# Patient Record
Sex: Male | Born: 1940
Health system: Southern US, Community
[De-identification: ages and names within clinical notes are randomized; demographics above are authoritative.]

## PROBLEM LIST (undated history)

## (undated) DIAGNOSIS — E119 Type 2 diabetes mellitus without complications: Secondary | ICD-10-CM

## (undated) DIAGNOSIS — I10 Essential (primary) hypertension: Secondary | ICD-10-CM

## (undated) DIAGNOSIS — E785 Hyperlipidemia, unspecified: Secondary | ICD-10-CM

## (undated) HISTORY — PX: BACK SURGERY: SHX140

## (undated) HISTORY — DX: Hyperlipidemia, unspecified: E78.5

## (undated) HISTORY — DX: Type 2 diabetes mellitus without complications: E11.9

## (undated) HISTORY — DX: Essential (primary) hypertension: I10

## (undated) HISTORY — PX: TONSILLECTOMY: SUR1361

## (undated) HISTORY — PX: PROSTATE SURGERY: SHX751

## (undated) HISTORY — PX: HAND SURGERY: SHX662

---

## 2012-04-17 DIAGNOSIS — I10 Essential (primary) hypertension: Secondary | ICD-10-CM | POA: Insufficient documentation

## 2012-04-17 DIAGNOSIS — E782 Mixed hyperlipidemia: Secondary | ICD-10-CM | POA: Insufficient documentation

## 2012-04-17 DIAGNOSIS — H4089 Other specified glaucoma: Secondary | ICD-10-CM | POA: Insufficient documentation

## 2012-09-24 DIAGNOSIS — N200 Calculus of kidney: Secondary | ICD-10-CM | POA: Insufficient documentation

## 2013-01-31 DIAGNOSIS — N281 Cyst of kidney, acquired: Secondary | ICD-10-CM | POA: Insufficient documentation

## 2013-03-18 DIAGNOSIS — M720 Palmar fascial fibromatosis [Dupuytren]: Secondary | ICD-10-CM | POA: Insufficient documentation

## 2014-10-28 DIAGNOSIS — N400 Enlarged prostate without lower urinary tract symptoms: Secondary | ICD-10-CM | POA: Insufficient documentation

## 2015-06-04 DIAGNOSIS — E119 Type 2 diabetes mellitus without complications: Secondary | ICD-10-CM | POA: Insufficient documentation

## 2015-11-19 DIAGNOSIS — L820 Inflamed seborrheic keratosis: Secondary | ICD-10-CM | POA: Diagnosis not present

## 2015-11-19 DIAGNOSIS — L538 Other specified erythematous conditions: Secondary | ICD-10-CM | POA: Diagnosis not present

## 2015-11-19 DIAGNOSIS — L570 Actinic keratosis: Secondary | ICD-10-CM | POA: Diagnosis not present

## 2015-11-19 DIAGNOSIS — D1801 Hemangioma of skin and subcutaneous tissue: Secondary | ICD-10-CM | POA: Diagnosis not present

## 2015-11-19 DIAGNOSIS — R238 Other skin changes: Secondary | ICD-10-CM | POA: Diagnosis not present

## 2015-12-04 DIAGNOSIS — Z0000 Encounter for general adult medical examination without abnormal findings: Secondary | ICD-10-CM | POA: Diagnosis not present

## 2015-12-04 DIAGNOSIS — E789 Disorder of lipoprotein metabolism, unspecified: Secondary | ICD-10-CM | POA: Diagnosis not present

## 2015-12-08 DIAGNOSIS — I10 Essential (primary) hypertension: Secondary | ICD-10-CM | POA: Diagnosis not present

## 2015-12-08 DIAGNOSIS — E782 Mixed hyperlipidemia: Secondary | ICD-10-CM | POA: Diagnosis not present

## 2015-12-08 DIAGNOSIS — E119 Type 2 diabetes mellitus without complications: Secondary | ICD-10-CM | POA: Diagnosis not present

## 2015-12-08 DIAGNOSIS — R1312 Dysphagia, oropharyngeal phase: Secondary | ICD-10-CM | POA: Diagnosis not present

## 2015-12-14 DIAGNOSIS — R1312 Dysphagia, oropharyngeal phase: Secondary | ICD-10-CM | POA: Diagnosis not present

## 2015-12-14 DIAGNOSIS — R05 Cough: Secondary | ICD-10-CM | POA: Diagnosis not present

## 2015-12-14 DIAGNOSIS — R1310 Dysphagia, unspecified: Secondary | ICD-10-CM | POA: Diagnosis not present

## 2016-03-16 DIAGNOSIS — R400 Somnolence: Secondary | ICD-10-CM | POA: Diagnosis not present

## 2016-03-16 DIAGNOSIS — E782 Mixed hyperlipidemia: Secondary | ICD-10-CM | POA: Diagnosis not present

## 2016-03-16 DIAGNOSIS — E119 Type 2 diabetes mellitus without complications: Secondary | ICD-10-CM | POA: Diagnosis not present

## 2016-03-16 DIAGNOSIS — I10 Essential (primary) hypertension: Secondary | ICD-10-CM | POA: Diagnosis not present

## 2016-03-22 DIAGNOSIS — R400 Somnolence: Secondary | ICD-10-CM | POA: Diagnosis not present

## 2016-03-22 DIAGNOSIS — G4710 Hypersomnia, unspecified: Secondary | ICD-10-CM | POA: Diagnosis not present

## 2016-03-22 DIAGNOSIS — G4700 Insomnia, unspecified: Secondary | ICD-10-CM | POA: Diagnosis not present

## 2016-03-22 DIAGNOSIS — R0683 Snoring: Secondary | ICD-10-CM | POA: Diagnosis not present

## 2016-04-13 DIAGNOSIS — H53432 Sector or arcuate defects, left eye: Secondary | ICD-10-CM | POA: Diagnosis not present

## 2016-04-13 DIAGNOSIS — H2513 Age-related nuclear cataract, bilateral: Secondary | ICD-10-CM | POA: Diagnosis not present

## 2016-04-13 DIAGNOSIS — H401123 Primary open-angle glaucoma, left eye, severe stage: Secondary | ICD-10-CM | POA: Diagnosis not present

## 2016-04-13 DIAGNOSIS — H401111 Primary open-angle glaucoma, right eye, mild stage: Secondary | ICD-10-CM | POA: Diagnosis not present

## 2016-04-13 DIAGNOSIS — H524 Presbyopia: Secondary | ICD-10-CM | POA: Diagnosis not present

## 2016-04-25 DIAGNOSIS — G4761 Periodic limb movement disorder: Secondary | ICD-10-CM | POA: Diagnosis not present

## 2016-04-25 DIAGNOSIS — R400 Somnolence: Secondary | ICD-10-CM | POA: Diagnosis not present

## 2016-04-25 DIAGNOSIS — G4710 Hypersomnia, unspecified: Secondary | ICD-10-CM | POA: Diagnosis not present

## 2016-04-25 DIAGNOSIS — G4733 Obstructive sleep apnea (adult) (pediatric): Secondary | ICD-10-CM | POA: Diagnosis not present

## 2016-04-25 DIAGNOSIS — G4700 Insomnia, unspecified: Secondary | ICD-10-CM | POA: Diagnosis not present

## 2016-04-25 DIAGNOSIS — R0683 Snoring: Secondary | ICD-10-CM | POA: Diagnosis not present

## 2016-05-04 DIAGNOSIS — G4733 Obstructive sleep apnea (adult) (pediatric): Secondary | ICD-10-CM | POA: Diagnosis not present

## 2016-06-06 DIAGNOSIS — G4733 Obstructive sleep apnea (adult) (pediatric): Secondary | ICD-10-CM | POA: Diagnosis not present

## 2016-07-13 DIAGNOSIS — E782 Mixed hyperlipidemia: Secondary | ICD-10-CM | POA: Diagnosis not present

## 2016-07-13 DIAGNOSIS — R432 Parageusia: Secondary | ICD-10-CM | POA: Insufficient documentation

## 2016-07-13 DIAGNOSIS — I10 Essential (primary) hypertension: Secondary | ICD-10-CM | POA: Diagnosis not present

## 2016-07-13 DIAGNOSIS — Z23 Encounter for immunization: Secondary | ICD-10-CM | POA: Diagnosis not present

## 2016-07-13 DIAGNOSIS — E119 Type 2 diabetes mellitus without complications: Secondary | ICD-10-CM | POA: Diagnosis not present

## 2016-07-14 DIAGNOSIS — G4733 Obstructive sleep apnea (adult) (pediatric): Secondary | ICD-10-CM | POA: Insufficient documentation

## 2016-07-14 DIAGNOSIS — G4731 Primary central sleep apnea: Secondary | ICD-10-CM | POA: Diagnosis not present

## 2016-07-14 DIAGNOSIS — R0609 Other forms of dyspnea: Secondary | ICD-10-CM | POA: Diagnosis not present

## 2016-07-14 DIAGNOSIS — G4700 Insomnia, unspecified: Secondary | ICD-10-CM | POA: Diagnosis not present

## 2016-07-20 DIAGNOSIS — I517 Cardiomegaly: Secondary | ICD-10-CM | POA: Diagnosis not present

## 2016-07-20 DIAGNOSIS — G4731 Primary central sleep apnea: Secondary | ICD-10-CM | POA: Diagnosis not present

## 2016-07-20 DIAGNOSIS — R0609 Other forms of dyspnea: Secondary | ICD-10-CM | POA: Diagnosis not present

## 2016-07-20 DIAGNOSIS — I083 Combined rheumatic disorders of mitral, aortic and tricuspid valves: Secondary | ICD-10-CM | POA: Diagnosis not present

## 2016-08-10 DIAGNOSIS — M25511 Pain in right shoulder: Secondary | ICD-10-CM | POA: Diagnosis not present

## 2016-08-10 DIAGNOSIS — I440 Atrioventricular block, first degree: Secondary | ICD-10-CM | POA: Diagnosis not present

## 2016-08-10 DIAGNOSIS — G8929 Other chronic pain: Secondary | ICD-10-CM | POA: Diagnosis not present

## 2016-08-10 DIAGNOSIS — E782 Mixed hyperlipidemia: Secondary | ICD-10-CM | POA: Diagnosis not present

## 2016-08-10 DIAGNOSIS — Z23 Encounter for immunization: Secondary | ICD-10-CM | POA: Diagnosis not present

## 2016-08-11 DIAGNOSIS — G4733 Obstructive sleep apnea (adult) (pediatric): Secondary | ICD-10-CM | POA: Diagnosis not present

## 2016-08-17 DIAGNOSIS — M19012 Primary osteoarthritis, left shoulder: Secondary | ICD-10-CM | POA: Diagnosis not present

## 2016-08-17 DIAGNOSIS — M25511 Pain in right shoulder: Secondary | ICD-10-CM | POA: Diagnosis not present

## 2016-08-17 DIAGNOSIS — G8929 Other chronic pain: Secondary | ICD-10-CM | POA: Diagnosis not present

## 2016-08-18 DIAGNOSIS — M19012 Primary osteoarthritis, left shoulder: Secondary | ICD-10-CM | POA: Diagnosis not present

## 2016-08-23 DIAGNOSIS — G8929 Other chronic pain: Secondary | ICD-10-CM | POA: Diagnosis not present

## 2016-08-23 DIAGNOSIS — Z01818 Encounter for other preprocedural examination: Secondary | ICD-10-CM | POA: Diagnosis not present

## 2016-08-23 DIAGNOSIS — M25512 Pain in left shoulder: Secondary | ICD-10-CM | POA: Diagnosis not present

## 2016-08-29 DIAGNOSIS — Z7984 Long term (current) use of oral hypoglycemic drugs: Secondary | ICD-10-CM | POA: Diagnosis not present

## 2016-08-29 DIAGNOSIS — N281 Cyst of kidney, acquired: Secondary | ICD-10-CM | POA: Diagnosis present

## 2016-08-29 DIAGNOSIS — N200 Calculus of kidney: Secondary | ICD-10-CM | POA: Diagnosis present

## 2016-08-29 DIAGNOSIS — M7522 Bicipital tendinitis, left shoulder: Secondary | ICD-10-CM | POA: Diagnosis not present

## 2016-08-29 DIAGNOSIS — Z9889 Other specified postprocedural states: Secondary | ICD-10-CM | POA: Diagnosis not present

## 2016-08-29 DIAGNOSIS — M19012 Primary osteoarthritis, left shoulder: Secondary | ICD-10-CM | POA: Diagnosis not present

## 2016-08-29 DIAGNOSIS — M25712 Osteophyte, left shoulder: Secondary | ICD-10-CM | POA: Diagnosis present

## 2016-08-29 DIAGNOSIS — G4733 Obstructive sleep apnea (adult) (pediatric): Secondary | ICD-10-CM | POA: Diagnosis present

## 2016-08-29 DIAGNOSIS — I10 Essential (primary) hypertension: Secondary | ICD-10-CM | POA: Diagnosis present

## 2016-08-29 DIAGNOSIS — E119 Type 2 diabetes mellitus without complications: Secondary | ICD-10-CM | POA: Diagnosis present

## 2016-08-29 DIAGNOSIS — E782 Mixed hyperlipidemia: Secondary | ICD-10-CM | POA: Diagnosis present

## 2016-08-29 DIAGNOSIS — Z87891 Personal history of nicotine dependence: Secondary | ICD-10-CM | POA: Diagnosis not present

## 2016-08-29 DIAGNOSIS — G8918 Other acute postprocedural pain: Secondary | ICD-10-CM | POA: Diagnosis not present

## 2016-09-06 DIAGNOSIS — Z96612 Presence of left artificial shoulder joint: Secondary | ICD-10-CM | POA: Diagnosis not present

## 2016-09-06 DIAGNOSIS — M25512 Pain in left shoulder: Secondary | ICD-10-CM | POA: Diagnosis not present

## 2016-09-06 DIAGNOSIS — R29898 Other symptoms and signs involving the musculoskeletal system: Secondary | ICD-10-CM | POA: Diagnosis not present

## 2016-09-06 DIAGNOSIS — M25612 Stiffness of left shoulder, not elsewhere classified: Secondary | ICD-10-CM | POA: Diagnosis not present

## 2016-09-08 DIAGNOSIS — M7989 Other specified soft tissue disorders: Secondary | ICD-10-CM | POA: Diagnosis not present

## 2016-09-08 DIAGNOSIS — Z96612 Presence of left artificial shoulder joint: Secondary | ICD-10-CM | POA: Diagnosis not present

## 2016-09-08 DIAGNOSIS — R200 Anesthesia of skin: Secondary | ICD-10-CM | POA: Diagnosis not present

## 2016-09-08 DIAGNOSIS — M25512 Pain in left shoulder: Secondary | ICD-10-CM | POA: Diagnosis not present

## 2016-09-08 DIAGNOSIS — R29898 Other symptoms and signs involving the musculoskeletal system: Secondary | ICD-10-CM | POA: Diagnosis not present

## 2016-09-08 DIAGNOSIS — M25612 Stiffness of left shoulder, not elsewhere classified: Secondary | ICD-10-CM | POA: Diagnosis not present

## 2016-09-08 DIAGNOSIS — Z471 Aftercare following joint replacement surgery: Secondary | ICD-10-CM | POA: Diagnosis not present

## 2016-09-12 DIAGNOSIS — F4321 Adjustment disorder with depressed mood: Secondary | ICD-10-CM | POA: Diagnosis not present

## 2016-09-12 DIAGNOSIS — E119 Type 2 diabetes mellitus without complications: Secondary | ICD-10-CM | POA: Diagnosis not present

## 2016-09-12 DIAGNOSIS — G8929 Other chronic pain: Secondary | ICD-10-CM | POA: Diagnosis not present

## 2016-09-12 DIAGNOSIS — M25511 Pain in right shoulder: Secondary | ICD-10-CM | POA: Diagnosis not present

## 2016-09-12 DIAGNOSIS — I10 Essential (primary) hypertension: Secondary | ICD-10-CM | POA: Diagnosis not present

## 2016-09-14 DIAGNOSIS — R29898 Other symptoms and signs involving the musculoskeletal system: Secondary | ICD-10-CM | POA: Diagnosis not present

## 2016-09-14 DIAGNOSIS — M25512 Pain in left shoulder: Secondary | ICD-10-CM | POA: Diagnosis not present

## 2016-09-14 DIAGNOSIS — Z96612 Presence of left artificial shoulder joint: Secondary | ICD-10-CM | POA: Diagnosis not present

## 2016-09-14 DIAGNOSIS — M25612 Stiffness of left shoulder, not elsewhere classified: Secondary | ICD-10-CM | POA: Diagnosis not present

## 2016-09-16 DIAGNOSIS — M25512 Pain in left shoulder: Secondary | ICD-10-CM | POA: Diagnosis not present

## 2016-09-16 DIAGNOSIS — Z96612 Presence of left artificial shoulder joint: Secondary | ICD-10-CM | POA: Diagnosis not present

## 2016-09-16 DIAGNOSIS — M25612 Stiffness of left shoulder, not elsewhere classified: Secondary | ICD-10-CM | POA: Diagnosis not present

## 2016-09-16 DIAGNOSIS — R29898 Other symptoms and signs involving the musculoskeletal system: Secondary | ICD-10-CM | POA: Diagnosis not present

## 2016-09-20 DIAGNOSIS — M25512 Pain in left shoulder: Secondary | ICD-10-CM | POA: Diagnosis not present

## 2016-09-20 DIAGNOSIS — R29898 Other symptoms and signs involving the musculoskeletal system: Secondary | ICD-10-CM | POA: Diagnosis not present

## 2016-09-20 DIAGNOSIS — Z96612 Presence of left artificial shoulder joint: Secondary | ICD-10-CM | POA: Diagnosis not present

## 2016-09-20 DIAGNOSIS — M25612 Stiffness of left shoulder, not elsewhere classified: Secondary | ICD-10-CM | POA: Diagnosis not present

## 2016-09-22 DIAGNOSIS — Z96612 Presence of left artificial shoulder joint: Secondary | ICD-10-CM | POA: Diagnosis not present

## 2016-09-22 DIAGNOSIS — M25612 Stiffness of left shoulder, not elsewhere classified: Secondary | ICD-10-CM | POA: Diagnosis not present

## 2016-09-22 DIAGNOSIS — R29898 Other symptoms and signs involving the musculoskeletal system: Secondary | ICD-10-CM | POA: Diagnosis not present

## 2016-09-22 DIAGNOSIS — M25512 Pain in left shoulder: Secondary | ICD-10-CM | POA: Diagnosis not present

## 2016-09-27 DIAGNOSIS — M25512 Pain in left shoulder: Secondary | ICD-10-CM | POA: Diagnosis not present

## 2016-09-27 DIAGNOSIS — R29898 Other symptoms and signs involving the musculoskeletal system: Secondary | ICD-10-CM | POA: Diagnosis not present

## 2016-09-27 DIAGNOSIS — Z96612 Presence of left artificial shoulder joint: Secondary | ICD-10-CM | POA: Diagnosis not present

## 2016-09-27 DIAGNOSIS — M25612 Stiffness of left shoulder, not elsewhere classified: Secondary | ICD-10-CM | POA: Diagnosis not present

## 2016-09-29 DIAGNOSIS — R29898 Other symptoms and signs involving the musculoskeletal system: Secondary | ICD-10-CM | POA: Diagnosis not present

## 2016-09-29 DIAGNOSIS — Z96612 Presence of left artificial shoulder joint: Secondary | ICD-10-CM | POA: Diagnosis not present

## 2016-09-29 DIAGNOSIS — M25512 Pain in left shoulder: Secondary | ICD-10-CM | POA: Diagnosis not present

## 2016-09-29 DIAGNOSIS — M25612 Stiffness of left shoulder, not elsewhere classified: Secondary | ICD-10-CM | POA: Diagnosis not present

## 2016-10-04 DIAGNOSIS — Z96612 Presence of left artificial shoulder joint: Secondary | ICD-10-CM | POA: Diagnosis not present

## 2016-10-04 DIAGNOSIS — M25612 Stiffness of left shoulder, not elsewhere classified: Secondary | ICD-10-CM | POA: Diagnosis not present

## 2016-10-04 DIAGNOSIS — R29898 Other symptoms and signs involving the musculoskeletal system: Secondary | ICD-10-CM | POA: Diagnosis not present

## 2016-10-04 DIAGNOSIS — M25512 Pain in left shoulder: Secondary | ICD-10-CM | POA: Diagnosis not present

## 2016-10-11 DIAGNOSIS — M25512 Pain in left shoulder: Secondary | ICD-10-CM | POA: Diagnosis not present

## 2016-10-11 DIAGNOSIS — R29898 Other symptoms and signs involving the musculoskeletal system: Secondary | ICD-10-CM | POA: Diagnosis not present

## 2016-10-11 DIAGNOSIS — Z96612 Presence of left artificial shoulder joint: Secondary | ICD-10-CM | POA: Diagnosis not present

## 2016-10-11 DIAGNOSIS — M25612 Stiffness of left shoulder, not elsewhere classified: Secondary | ICD-10-CM | POA: Diagnosis not present

## 2016-10-13 DIAGNOSIS — Z96612 Presence of left artificial shoulder joint: Secondary | ICD-10-CM | POA: Diagnosis not present

## 2016-10-13 DIAGNOSIS — M25612 Stiffness of left shoulder, not elsewhere classified: Secondary | ICD-10-CM | POA: Diagnosis not present

## 2016-10-13 DIAGNOSIS — M25512 Pain in left shoulder: Secondary | ICD-10-CM | POA: Diagnosis not present

## 2016-10-13 DIAGNOSIS — R29898 Other symptoms and signs involving the musculoskeletal system: Secondary | ICD-10-CM | POA: Diagnosis not present

## 2016-10-17 DIAGNOSIS — R29898 Other symptoms and signs involving the musculoskeletal system: Secondary | ICD-10-CM | POA: Diagnosis not present

## 2016-10-17 DIAGNOSIS — Z96612 Presence of left artificial shoulder joint: Secondary | ICD-10-CM | POA: Diagnosis not present

## 2016-10-17 DIAGNOSIS — M25512 Pain in left shoulder: Secondary | ICD-10-CM | POA: Diagnosis not present

## 2016-10-17 DIAGNOSIS — M25612 Stiffness of left shoulder, not elsewhere classified: Secondary | ICD-10-CM | POA: Diagnosis not present

## 2016-10-19 DIAGNOSIS — H401111 Primary open-angle glaucoma, right eye, mild stage: Secondary | ICD-10-CM | POA: Diagnosis not present

## 2016-10-19 DIAGNOSIS — H401123 Primary open-angle glaucoma, left eye, severe stage: Secondary | ICD-10-CM | POA: Diagnosis not present

## 2016-10-19 DIAGNOSIS — H53432 Sector or arcuate defects, left eye: Secondary | ICD-10-CM | POA: Diagnosis not present

## 2016-10-20 DIAGNOSIS — M25612 Stiffness of left shoulder, not elsewhere classified: Secondary | ICD-10-CM | POA: Diagnosis not present

## 2016-10-20 DIAGNOSIS — Z96612 Presence of left artificial shoulder joint: Secondary | ICD-10-CM | POA: Diagnosis not present

## 2016-10-20 DIAGNOSIS — R29898 Other symptoms and signs involving the musculoskeletal system: Secondary | ICD-10-CM | POA: Diagnosis not present

## 2016-10-20 DIAGNOSIS — M25512 Pain in left shoulder: Secondary | ICD-10-CM | POA: Diagnosis not present

## 2016-10-24 DIAGNOSIS — M25512 Pain in left shoulder: Secondary | ICD-10-CM | POA: Diagnosis not present

## 2016-10-24 DIAGNOSIS — R29898 Other symptoms and signs involving the musculoskeletal system: Secondary | ICD-10-CM | POA: Diagnosis not present

## 2016-10-24 DIAGNOSIS — Z96612 Presence of left artificial shoulder joint: Secondary | ICD-10-CM | POA: Diagnosis not present

## 2016-10-24 DIAGNOSIS — M25612 Stiffness of left shoulder, not elsewhere classified: Secondary | ICD-10-CM | POA: Diagnosis not present

## 2016-10-27 DIAGNOSIS — M25512 Pain in left shoulder: Secondary | ICD-10-CM | POA: Diagnosis not present

## 2016-10-27 DIAGNOSIS — M25612 Stiffness of left shoulder, not elsewhere classified: Secondary | ICD-10-CM | POA: Diagnosis not present

## 2016-10-27 DIAGNOSIS — Z96612 Presence of left artificial shoulder joint: Secondary | ICD-10-CM | POA: Diagnosis not present

## 2016-10-27 DIAGNOSIS — R29898 Other symptoms and signs involving the musculoskeletal system: Secondary | ICD-10-CM | POA: Diagnosis not present

## 2016-10-31 DIAGNOSIS — Z96612 Presence of left artificial shoulder joint: Secondary | ICD-10-CM | POA: Diagnosis not present

## 2016-10-31 DIAGNOSIS — R29898 Other symptoms and signs involving the musculoskeletal system: Secondary | ICD-10-CM | POA: Diagnosis not present

## 2016-10-31 DIAGNOSIS — M25512 Pain in left shoulder: Secondary | ICD-10-CM | POA: Diagnosis not present

## 2016-10-31 DIAGNOSIS — M25612 Stiffness of left shoulder, not elsewhere classified: Secondary | ICD-10-CM | POA: Diagnosis not present

## 2016-11-02 DIAGNOSIS — Z96612 Presence of left artificial shoulder joint: Secondary | ICD-10-CM | POA: Diagnosis not present

## 2016-11-02 DIAGNOSIS — G4733 Obstructive sleep apnea (adult) (pediatric): Secondary | ICD-10-CM | POA: Diagnosis not present

## 2016-11-02 DIAGNOSIS — E119 Type 2 diabetes mellitus without complications: Secondary | ICD-10-CM | POA: Diagnosis not present

## 2016-11-02 DIAGNOSIS — M25512 Pain in left shoulder: Secondary | ICD-10-CM | POA: Diagnosis not present

## 2016-11-03 DIAGNOSIS — M25612 Stiffness of left shoulder, not elsewhere classified: Secondary | ICD-10-CM | POA: Diagnosis not present

## 2016-11-03 DIAGNOSIS — R29898 Other symptoms and signs involving the musculoskeletal system: Secondary | ICD-10-CM | POA: Diagnosis not present

## 2016-11-03 DIAGNOSIS — Z96612 Presence of left artificial shoulder joint: Secondary | ICD-10-CM | POA: Diagnosis not present

## 2016-11-03 DIAGNOSIS — M25512 Pain in left shoulder: Secondary | ICD-10-CM | POA: Diagnosis not present

## 2016-11-15 DIAGNOSIS — R29898 Other symptoms and signs involving the musculoskeletal system: Secondary | ICD-10-CM | POA: Diagnosis not present

## 2016-11-15 DIAGNOSIS — M25612 Stiffness of left shoulder, not elsewhere classified: Secondary | ICD-10-CM | POA: Diagnosis not present

## 2016-11-15 DIAGNOSIS — Z96612 Presence of left artificial shoulder joint: Secondary | ICD-10-CM | POA: Diagnosis not present

## 2016-11-18 DIAGNOSIS — R29898 Other symptoms and signs involving the musculoskeletal system: Secondary | ICD-10-CM | POA: Diagnosis not present

## 2016-11-18 DIAGNOSIS — Z96612 Presence of left artificial shoulder joint: Secondary | ICD-10-CM | POA: Diagnosis not present

## 2016-11-18 DIAGNOSIS — M25612 Stiffness of left shoulder, not elsewhere classified: Secondary | ICD-10-CM | POA: Diagnosis not present

## 2016-11-22 DIAGNOSIS — M25612 Stiffness of left shoulder, not elsewhere classified: Secondary | ICD-10-CM | POA: Diagnosis not present

## 2016-11-22 DIAGNOSIS — Z96612 Presence of left artificial shoulder joint: Secondary | ICD-10-CM | POA: Diagnosis not present

## 2016-11-22 DIAGNOSIS — R29898 Other symptoms and signs involving the musculoskeletal system: Secondary | ICD-10-CM | POA: Diagnosis not present

## 2016-11-25 DIAGNOSIS — M25612 Stiffness of left shoulder, not elsewhere classified: Secondary | ICD-10-CM | POA: Diagnosis not present

## 2016-11-25 DIAGNOSIS — R29898 Other symptoms and signs involving the musculoskeletal system: Secondary | ICD-10-CM | POA: Diagnosis not present

## 2016-11-25 DIAGNOSIS — Z96612 Presence of left artificial shoulder joint: Secondary | ICD-10-CM | POA: Diagnosis not present

## 2016-11-29 DIAGNOSIS — Z96612 Presence of left artificial shoulder joint: Secondary | ICD-10-CM | POA: Diagnosis not present

## 2016-11-29 DIAGNOSIS — R29898 Other symptoms and signs involving the musculoskeletal system: Secondary | ICD-10-CM | POA: Diagnosis not present

## 2016-11-29 DIAGNOSIS — M25612 Stiffness of left shoulder, not elsewhere classified: Secondary | ICD-10-CM | POA: Diagnosis not present

## 2016-12-02 DIAGNOSIS — M25612 Stiffness of left shoulder, not elsewhere classified: Secondary | ICD-10-CM | POA: Diagnosis not present

## 2016-12-02 DIAGNOSIS — R29898 Other symptoms and signs involving the musculoskeletal system: Secondary | ICD-10-CM | POA: Diagnosis not present

## 2016-12-02 DIAGNOSIS — Z96612 Presence of left artificial shoulder joint: Secondary | ICD-10-CM | POA: Diagnosis not present

## 2016-12-05 DIAGNOSIS — Z96612 Presence of left artificial shoulder joint: Secondary | ICD-10-CM | POA: Diagnosis not present

## 2016-12-05 DIAGNOSIS — R29898 Other symptoms and signs involving the musculoskeletal system: Secondary | ICD-10-CM | POA: Diagnosis not present

## 2016-12-08 DIAGNOSIS — Z96612 Presence of left artificial shoulder joint: Secondary | ICD-10-CM | POA: Diagnosis not present

## 2016-12-08 DIAGNOSIS — R29898 Other symptoms and signs involving the musculoskeletal system: Secondary | ICD-10-CM | POA: Diagnosis not present

## 2016-12-12 DIAGNOSIS — R29898 Other symptoms and signs involving the musculoskeletal system: Secondary | ICD-10-CM | POA: Diagnosis not present

## 2016-12-12 DIAGNOSIS — Z96612 Presence of left artificial shoulder joint: Secondary | ICD-10-CM | POA: Diagnosis not present

## 2016-12-14 DIAGNOSIS — G4733 Obstructive sleep apnea (adult) (pediatric): Secondary | ICD-10-CM | POA: Diagnosis not present

## 2016-12-15 DIAGNOSIS — R29898 Other symptoms and signs involving the musculoskeletal system: Secondary | ICD-10-CM | POA: Diagnosis not present

## 2016-12-15 DIAGNOSIS — Z96612 Presence of left artificial shoulder joint: Secondary | ICD-10-CM | POA: Diagnosis not present

## 2016-12-16 DIAGNOSIS — M25512 Pain in left shoulder: Secondary | ICD-10-CM | POA: Diagnosis not present

## 2016-12-16 DIAGNOSIS — Z96612 Presence of left artificial shoulder joint: Secondary | ICD-10-CM | POA: Diagnosis not present

## 2016-12-19 DIAGNOSIS — Z96612 Presence of left artificial shoulder joint: Secondary | ICD-10-CM | POA: Diagnosis not present

## 2016-12-19 DIAGNOSIS — R29898 Other symptoms and signs involving the musculoskeletal system: Secondary | ICD-10-CM | POA: Diagnosis not present

## 2016-12-22 DIAGNOSIS — R29898 Other symptoms and signs involving the musculoskeletal system: Secondary | ICD-10-CM | POA: Diagnosis not present

## 2016-12-22 DIAGNOSIS — Z96612 Presence of left artificial shoulder joint: Secondary | ICD-10-CM | POA: Diagnosis not present

## 2016-12-26 DIAGNOSIS — R29898 Other symptoms and signs involving the musculoskeletal system: Secondary | ICD-10-CM | POA: Diagnosis not present

## 2016-12-26 DIAGNOSIS — Z96612 Presence of left artificial shoulder joint: Secondary | ICD-10-CM | POA: Diagnosis not present

## 2016-12-29 DIAGNOSIS — R29898 Other symptoms and signs involving the musculoskeletal system: Secondary | ICD-10-CM | POA: Diagnosis not present

## 2016-12-29 DIAGNOSIS — Z96612 Presence of left artificial shoulder joint: Secondary | ICD-10-CM | POA: Diagnosis not present

## 2017-02-03 DIAGNOSIS — E782 Mixed hyperlipidemia: Secondary | ICD-10-CM | POA: Diagnosis not present

## 2017-02-03 DIAGNOSIS — Z125 Encounter for screening for malignant neoplasm of prostate: Secondary | ICD-10-CM | POA: Diagnosis not present

## 2017-02-03 DIAGNOSIS — E119 Type 2 diabetes mellitus without complications: Secondary | ICD-10-CM | POA: Diagnosis not present

## 2017-02-03 DIAGNOSIS — Z0000 Encounter for general adult medical examination without abnormal findings: Secondary | ICD-10-CM | POA: Diagnosis not present

## 2017-02-03 DIAGNOSIS — G4733 Obstructive sleep apnea (adult) (pediatric): Secondary | ICD-10-CM | POA: Diagnosis not present

## 2017-02-03 DIAGNOSIS — I10 Essential (primary) hypertension: Secondary | ICD-10-CM | POA: Diagnosis not present

## 2017-03-17 DIAGNOSIS — R202 Paresthesia of skin: Secondary | ICD-10-CM | POA: Diagnosis not present

## 2017-03-17 DIAGNOSIS — Z96612 Presence of left artificial shoulder joint: Secondary | ICD-10-CM | POA: Diagnosis not present

## 2017-03-17 DIAGNOSIS — R200 Anesthesia of skin: Secondary | ICD-10-CM | POA: Diagnosis not present

## 2017-03-17 DIAGNOSIS — M25512 Pain in left shoulder: Secondary | ICD-10-CM | POA: Diagnosis not present

## 2017-04-13 DIAGNOSIS — G540 Brachial plexus disorders: Secondary | ICD-10-CM | POA: Diagnosis not present

## 2017-04-13 DIAGNOSIS — G5603 Carpal tunnel syndrome, bilateral upper limbs: Secondary | ICD-10-CM | POA: Diagnosis not present

## 2017-04-13 DIAGNOSIS — G5622 Lesion of ulnar nerve, left upper limb: Secondary | ICD-10-CM | POA: Diagnosis not present

## 2017-04-18 DIAGNOSIS — G5602 Carpal tunnel syndrome, left upper limb: Secondary | ICD-10-CM | POA: Insufficient documentation

## 2017-04-18 DIAGNOSIS — G5622 Lesion of ulnar nerve, left upper limb: Secondary | ICD-10-CM | POA: Insufficient documentation

## 2017-04-18 DIAGNOSIS — G540 Brachial plexus disorders: Secondary | ICD-10-CM | POA: Insufficient documentation

## 2017-04-26 DIAGNOSIS — H2513 Age-related nuclear cataract, bilateral: Secondary | ICD-10-CM | POA: Diagnosis not present

## 2017-04-26 DIAGNOSIS — M6530 Trigger finger, unspecified finger: Secondary | ICD-10-CM | POA: Insufficient documentation

## 2017-04-26 DIAGNOSIS — H401123 Primary open-angle glaucoma, left eye, severe stage: Secondary | ICD-10-CM | POA: Diagnosis not present

## 2017-04-26 DIAGNOSIS — H524 Presbyopia: Secondary | ICD-10-CM | POA: Diagnosis not present

## 2017-04-26 DIAGNOSIS — Z96612 Presence of left artificial shoulder joint: Secondary | ICD-10-CM | POA: Diagnosis not present

## 2017-04-26 DIAGNOSIS — E119 Type 2 diabetes mellitus without complications: Secondary | ICD-10-CM | POA: Diagnosis not present

## 2017-04-26 DIAGNOSIS — G540 Brachial plexus disorders: Secondary | ICD-10-CM | POA: Diagnosis not present

## 2017-04-26 DIAGNOSIS — H53422 Scotoma of blind spot area, left eye: Secondary | ICD-10-CM | POA: Diagnosis not present

## 2017-04-26 DIAGNOSIS — H401111 Primary open-angle glaucoma, right eye, mild stage: Secondary | ICD-10-CM | POA: Diagnosis not present

## 2017-04-26 DIAGNOSIS — G5602 Carpal tunnel syndrome, left upper limb: Secondary | ICD-10-CM | POA: Diagnosis not present

## 2017-05-05 DIAGNOSIS — F419 Anxiety disorder, unspecified: Secondary | ICD-10-CM | POA: Diagnosis not present

## 2017-05-05 DIAGNOSIS — F329 Major depressive disorder, single episode, unspecified: Secondary | ICD-10-CM | POA: Diagnosis not present

## 2017-05-05 DIAGNOSIS — E782 Mixed hyperlipidemia: Secondary | ICD-10-CM | POA: Diagnosis not present

## 2017-05-05 DIAGNOSIS — G540 Brachial plexus disorders: Secondary | ICD-10-CM | POA: Diagnosis not present

## 2017-05-05 DIAGNOSIS — G4733 Obstructive sleep apnea (adult) (pediatric): Secondary | ICD-10-CM | POA: Diagnosis not present

## 2017-05-05 DIAGNOSIS — I10 Essential (primary) hypertension: Secondary | ICD-10-CM | POA: Diagnosis not present

## 2017-05-05 DIAGNOSIS — E119 Type 2 diabetes mellitus without complications: Secondary | ICD-10-CM | POA: Diagnosis not present

## 2017-05-11 DIAGNOSIS — M6530 Trigger finger, unspecified finger: Secondary | ICD-10-CM | POA: Diagnosis not present

## 2017-05-11 DIAGNOSIS — G5602 Carpal tunnel syndrome, left upper limb: Secondary | ICD-10-CM | POA: Diagnosis not present

## 2017-06-14 DIAGNOSIS — G4733 Obstructive sleep apnea (adult) (pediatric): Secondary | ICD-10-CM | POA: Diagnosis not present

## 2017-06-21 DIAGNOSIS — G5602 Carpal tunnel syndrome, left upper limb: Secondary | ICD-10-CM | POA: Diagnosis not present

## 2017-06-21 DIAGNOSIS — M6530 Trigger finger, unspecified finger: Secondary | ICD-10-CM | POA: Diagnosis not present

## 2017-06-21 DIAGNOSIS — M542 Cervicalgia: Secondary | ICD-10-CM | POA: Diagnosis not present

## 2017-07-31 DIAGNOSIS — G5602 Carpal tunnel syndrome, left upper limb: Secondary | ICD-10-CM | POA: Diagnosis not present

## 2017-08-02 ENCOUNTER — Encounter: Payer: Self-pay | Admitting: Osteopathic Medicine

## 2017-08-02 ENCOUNTER — Ambulatory Visit (INDEPENDENT_AMBULATORY_CARE_PROVIDER_SITE_OTHER): Payer: Medicare Other | Admitting: Osteopathic Medicine

## 2017-08-02 VITALS — BP 138/73 | HR 57 | Ht 70.0 in | Wt 185.0 lb

## 2017-08-02 DIAGNOSIS — E119 Type 2 diabetes mellitus without complications: Secondary | ICD-10-CM | POA: Diagnosis not present

## 2017-08-02 DIAGNOSIS — G4733 Obstructive sleep apnea (adult) (pediatric): Secondary | ICD-10-CM | POA: Diagnosis not present

## 2017-08-02 DIAGNOSIS — Z23 Encounter for immunization: Secondary | ICD-10-CM | POA: Diagnosis not present

## 2017-08-02 DIAGNOSIS — H409 Unspecified glaucoma: Secondary | ICD-10-CM | POA: Diagnosis not present

## 2017-08-02 DIAGNOSIS — I10 Essential (primary) hypertension: Secondary | ICD-10-CM

## 2017-08-02 LAB — POCT GLYCOSYLATED HEMOGLOBIN (HGB A1C): Hemoglobin A1C: 7.3

## 2017-08-02 MED ORDER — AMLODIPINE BESYLATE 5 MG PO TABS
5.0000 mg | ORAL_TABLET | Freq: Every day | ORAL | 3 refills | Status: DC
Start: 2017-08-02 — End: 2018-08-06

## 2017-08-02 MED ORDER — SIMVASTATIN 10 MG PO TABS: 10.0000 mg | ORAL_TABLET | Freq: Every day | ORAL | 3 refills | Status: DC

## 2017-08-02 MED ORDER — DULOXETINE HCL 20 MG PO CPEP: 20.0000 mg | ORAL_CAPSULE | Freq: Every day | ORAL | 0 refills | Status: DC

## 2017-08-02 NOTE — Progress Notes (Signed)
HPI: Levi Cochran is a 76 y.o. male  who presents to [facility] today, 08/02/17,  for chief complaint of:  Chief Complaint  Patient presents with  . Establish Care    primary care physcian     CARDIOVASCULAR: hx HTN, HLD. No CP/SOB. No Hx CAD known.   RESPIRATORY: no concerns. Former smoker, quit >30 years ago.   NEUROLOGICAL/PSYCH: Cymbalta for depression but also arthritis/pain control, working well for him. Also occaisonal use of Gabapentin for aches/nerve pains.   URINARY/REPRODUCTIVE: s.p prostate surgery, sounds like reduction for BPH, no hx prostate cancer   GASTROINTESTINAL: no problems  ENDOCRINE: DM2 controlled on Metformin   MUSCULOSKELETAL/RHEUM: L arm/hand pain, following with orthopedics   HEENT: glaucoma and cataracts, needs referral to ophtho locally      Past medical, surgical, social and family history reviewed: Patient Active Problem List   Diagnosis Date Noted  . Trigger finger, acquired 04/26/2017  . Brachial plexopathy 04/18/2017  . Carpal tunnel syndrome of left wrist 04/18/2017  . Ulnar neuropathy at elbow of left upper extremity 04/18/2017  . S/P shoulder replacement, left 10/11/2016  . Situational depression 09/12/2016  . Left shoulder pain 08/23/2016  . OSA (obstructive sleep apnea) 07/14/2016  . Dysgeusia 07/13/2016  . Type 2 diabetes mellitus without complication ([place]) 91/47/8295  . Benign enlargement of prostate 10/28/2014  . Contracture of palmar fascia 03/18/2013  . Acquired cyst of kidney 01/31/2013  . Kidney stone 09/24/2012  . Benign essential hypertension 04/17/2012  . Mixed hyperlipidemia 04/17/2012  . Other specified glaucoma 04/17/2012   Past Surgical History:  Procedure Laterality Date  . BACK SURGERY    . HAND SURGERY    . PROSTATE SURGERY    . TONSILLECTOMY     Social History  Substance Use Topics  . Smoking status: Not on file  . Smokeless tobacco: Not on file  . Alcohol use  Not on file   Family History  Problem Relation Age of Onset  . Cancer Mother   . Diabetes Mother   . Heart attack Sister      Current medication list and allergy/intolerance information reviewed:   Current Outpatient Prescriptions  Medication Sig Dispense Refill  . DULoxetine (CYMBALTA) 20 MG capsule Take 1 capsule (20 mg total) by mouth daily. 90 capsule 0  . gabapentin (NEURONTIN) 300 MG capsule TAKE ONE CAPSULE BY MOUTH NIGHTLY    . latanoprost (XALATAN) 0.005 % ophthalmic solution Administer 1 drop to both eyes daily.     Marland Kitchen lisinopril-hydrochlorothiazide (PRINZIDE,ZESTORETIC) 20-12.5 MG tablet TAKE 2 TABLETS BY MOUTH ONCE DAILY    . metFORMIN (GLUCOPHAGE) 500 MG tablet TAKE TWO TABLETS BY MOUTH TWICE DAILY WITH MEALS    . timolol (TIMOPTIC) 0.5 % ophthalmic solution Administer 1 drop to both eyes Two (2) times a day.     Marland Kitchen amLODipine (NORVASC) 5 MG tablet Take 1 tablet (5 mg total) by mouth daily. 90 tablet 3  . aspirin EC 81 MG tablet Take 81 mg by mouth.    . simvastatin (ZOCOR) 10 MG tablet Take 1 tablet (10 mg total) by mouth daily. 90 tablet 3   No current facility-administered medications for this visit.    No Known Allergies    Review of Systems:  Constitutional:  No  fever, no chills, No recent illness, No unintentional weight changes. No significant fatigue.   HEENT: No  headache, no vision change, no hearing change, No sore throat, No  sinus pressure  Cardiac:  No  chest pain, No  pressure, No palpitations  Respiratory:  No  shortness of breath. No  Cough  Gastrointestinal: No  abdominal pain, No  nausea, No  vomiting,  No  blood in stool, No  diarrhea, No  constipation   Musculoskeletal: No new myalgia/arthralgia  Skin: No  Rash, No other wounds/concerning lesions  Hem/Onc: No  easy bruising/bleeding, No  abnormal lymph node  Endocrine: No cold intolerance,  No heat intolerance. No polyuria/polydipsia/polyphagia   Neurologic: No  weakness, No  dizziness,  No  slurred speech/focal weakness/facial droop  Psychiatric: No  concerns with depression, No  concerns with anxiety, No sleep problems, No mood problems  Exam:  BP 138/73   Pulse (!) 57   Ht 5\' 10"  (1.778 m)   Wt 185 lb (83.9 kg)   BMI 26.54 kg/m   Constitutional: VS see above. General Appearance: alert, well-developed, well-nourished, NAD  Eyes: Normal lids and conjunctive, non-icteric sclera  Ears, Nose, Mouth, Throat: MMM, Normal external inspection ears/nares/mouth/lips/gums. Pharynx/tonsils no erythema, no exudate. Nasal mucosa normal.   Neck: No masses, trachea midline. No thyroid enlargement. No tenderness/mass appreciated. No lymphadenopathy  Respiratory: Normal respiratory effort. no wheeze, no rhonchi, no rales  Cardiovascular: S1/S2 normal, no murmur, no rub/gallop auscultated. RRR. No lower extremity edema.   Gastrointestinal: Nontender, no masses. No hepatomegaly, no splenomegaly. No hernia appreciated. Bowel sounds normal. Rectal exam deferred.   Musculoskeletal: Gait normal. No clubbing/cyanosis of digits.   Neurological: Normal balance/coordination. No tremor.   Skin: warm, dry, intact. No rash/ulcer.   Psychiatric: Normal judgment/insight. Normal mood and affect. Oriented x3.    Results for orders placed or performed in visit on 08/02/17 (from the past 72 hour(s))  POCT HgB A1C     Status: None   Collection Time: 08/02/17 10:32 AM  Result Value Ref Range   Hemoglobin A1C 7.3    Reviewed results in Care Everywhere, no major concerns on labs 01/2016     ASSESSMENT/PLAN:  Chronic conditions stable, follow up routinely for sugar recheck and as needed   Diabetes mellitus without complication ([name]) - Plan: POCT HgB A1C  Need for immunization against influenza - Plan: Flu vaccine HIGH DOSE PF  Need for 23-polyvalent pneumococcal polysaccharide vaccine - Plan: Pneumococcal polysaccharide vaccine 23-valent greater than or equal to 2yo  subcutaneous/IM  Need for diphtheria-tetanus-pertussis (Tdap) vaccine - Plan: Tdap vaccine greater than or equal to 7yo IM  Glaucoma of both eyes, unspecified glaucoma type - Plan: Ambulatory referral to Ophthalmology  OSA (obstructive sleep apnea)  Benign essential hypertension    Patient Instructions  Plan: Update vaccines today Refills today Eye doctor referral     Visit summary with medication list and pertinent instructions was printed for patient to review. All questions at time of visit were answered - patient instructed to contact office with any additional concerns. ER/RTC precautions were reviewed with the patient. Follow-up plan: Return in about 3 months (around 11/01/2017) for recheck sugars .

## 2017-08-02 NOTE — Patient Instructions (Signed)
Plan: Update vaccines today Refills today Eye doctor referral

## 2017-08-10 DIAGNOSIS — Z7982 Long term (current) use of aspirin: Secondary | ICD-10-CM | POA: Diagnosis not present

## 2017-08-10 DIAGNOSIS — G4730 Sleep apnea, unspecified: Secondary | ICD-10-CM | POA: Diagnosis not present

## 2017-08-10 DIAGNOSIS — I10 Essential (primary) hypertension: Secondary | ICD-10-CM | POA: Diagnosis not present

## 2017-08-10 DIAGNOSIS — E119 Type 2 diabetes mellitus without complications: Secondary | ICD-10-CM | POA: Diagnosis not present

## 2017-08-10 DIAGNOSIS — Z9989 Dependence on other enabling machines and devices: Secondary | ICD-10-CM | POA: Diagnosis not present

## 2017-08-10 DIAGNOSIS — Z79899 Other long term (current) drug therapy: Secondary | ICD-10-CM | POA: Diagnosis not present

## 2017-08-10 DIAGNOSIS — Z96619 Presence of unspecified artificial shoulder joint: Secondary | ICD-10-CM | POA: Diagnosis not present

## 2017-08-10 DIAGNOSIS — G5602 Carpal tunnel syndrome, left upper limb: Secondary | ICD-10-CM | POA: Diagnosis not present

## 2017-08-10 DIAGNOSIS — Z7984 Long term (current) use of oral hypoglycemic drugs: Secondary | ICD-10-CM | POA: Diagnosis not present

## 2017-08-10 DIAGNOSIS — M5412 Radiculopathy, cervical region: Secondary | ICD-10-CM | POA: Diagnosis not present

## 2017-08-10 DIAGNOSIS — Z87891 Personal history of nicotine dependence: Secondary | ICD-10-CM | POA: Diagnosis not present

## 2017-08-23 DIAGNOSIS — Z4802 Encounter for removal of sutures: Secondary | ICD-10-CM | POA: Diagnosis not present

## 2017-08-23 DIAGNOSIS — Z48811 Encounter for surgical aftercare following surgery on the nervous system: Secondary | ICD-10-CM | POA: Diagnosis not present

## 2017-08-23 DIAGNOSIS — G5602 Carpal tunnel syndrome, left upper limb: Secondary | ICD-10-CM | POA: Diagnosis not present

## 2017-09-01 ENCOUNTER — Other Ambulatory Visit: Payer: Self-pay

## 2017-09-01 MED ORDER — GABAPENTIN 300 MG PO CAPS: 300.0000 mg | ORAL_CAPSULE | Freq: Every day | ORAL | 0 refills | Status: DC

## 2017-09-01 NOTE — Telephone Encounter (Signed)
Pts wife was informed.

## 2017-09-01 NOTE — Telephone Encounter (Signed)
Pt's wife called and wants to get a refill on his gabapentin. This was previously filled by another provider. Please advise?

## 2017-09-07 ENCOUNTER — Telehealth: Payer: Self-pay

## 2017-09-07 MED ORDER — LISINOPRIL-HYDROCHLOROTHIAZIDE 20-12.5 MG PO TABS: 2.0000 | ORAL_TABLET | Freq: Every day | ORAL | 3 refills | Status: DC

## 2017-09-07 NOTE — Telephone Encounter (Signed)
Patient request refill for Lisinopril 20-12.5. #[phone] refills sent to [facility]. Nereyda Bowler,CMA

## 2017-09-18 DIAGNOSIS — Z4789 Encounter for other orthopedic aftercare: Secondary | ICD-10-CM | POA: Diagnosis not present

## 2017-09-18 DIAGNOSIS — M65342 Trigger finger, left ring finger: Secondary | ICD-10-CM | POA: Diagnosis not present

## 2017-09-18 DIAGNOSIS — G5602 Carpal tunnel syndrome, left upper limb: Secondary | ICD-10-CM | POA: Diagnosis not present

## 2017-09-21 DIAGNOSIS — M65342 Trigger finger, left ring finger: Secondary | ICD-10-CM | POA: Diagnosis not present

## 2017-10-02 ENCOUNTER — Other Ambulatory Visit: Payer: Self-pay | Admitting: Osteopathic Medicine

## 2017-10-02 DIAGNOSIS — E119 Type 2 diabetes mellitus without complications: Secondary | ICD-10-CM | POA: Diagnosis not present

## 2017-10-02 DIAGNOSIS — H02831 Dermatochalasis of right upper eyelid: Secondary | ICD-10-CM | POA: Diagnosis not present

## 2017-10-02 DIAGNOSIS — H527 Unspecified disorder of refraction: Secondary | ICD-10-CM | POA: Diagnosis not present

## 2017-10-02 DIAGNOSIS — H40003 Preglaucoma, unspecified, bilateral: Secondary | ICD-10-CM | POA: Diagnosis not present

## 2017-10-02 DIAGNOSIS — H43811 Vitreous degeneration, right eye: Secondary | ICD-10-CM | POA: Diagnosis not present

## 2017-10-02 DIAGNOSIS — H02834 Dermatochalasis of left upper eyelid: Secondary | ICD-10-CM | POA: Diagnosis not present

## 2017-10-02 DIAGNOSIS — H25813 Combined forms of age-related cataract, bilateral: Secondary | ICD-10-CM | POA: Diagnosis not present

## 2017-10-02 LAB — HM DIABETES EYE EXAM

## 2017-10-27 ENCOUNTER — Encounter: Payer: Self-pay | Admitting: Osteopathic Medicine

## 2017-10-30 ENCOUNTER — Other Ambulatory Visit: Payer: Self-pay | Admitting: Osteopathic Medicine

## 2017-11-01 ENCOUNTER — Encounter: Payer: Self-pay | Admitting: Osteopathic Medicine

## 2017-11-01 ENCOUNTER — Ambulatory Visit (INDEPENDENT_AMBULATORY_CARE_PROVIDER_SITE_OTHER): Payer: Medicare Other | Admitting: Osteopathic Medicine

## 2017-11-01 VITALS — BP 135/65 | HR 55 | Temp 98.0°F | Wt 185.7 lb

## 2017-11-01 DIAGNOSIS — M25511 Pain in right shoulder: Secondary | ICD-10-CM | POA: Diagnosis not present

## 2017-11-01 DIAGNOSIS — E119 Type 2 diabetes mellitus without complications: Secondary | ICD-10-CM

## 2017-11-01 DIAGNOSIS — G8929 Other chronic pain: Secondary | ICD-10-CM | POA: Diagnosis not present

## 2017-11-01 DIAGNOSIS — I10 Essential (primary) hypertension: Secondary | ICD-10-CM

## 2017-11-01 LAB — POCT GLYCOSYLATED HEMOGLOBIN (HGB A1C): HEMOGLOBIN A1C: 7.3

## 2017-11-01 NOTE — Progress Notes (Signed)
HPI: Levi Cochran is a 76 y.o. male  who presents to [facility] today, 11/01/17,  for chief complaint of:  Chief Complaint  Patient presents with  . Follow-up     CARDIOVASCULAR: hx HTN, HLD. No CP/SOB. No Hx CAD known. Doing well.   RESPIRATORY: no concerns. Former smoker, quit >30 years ago.   NEUROLOGICAL/PSYCH: Cymbalta for depression but also arthritis/pain control, working well for him. Also occaisonal use of Gabapentin for aches/nerve pains.   URINARY/REPRODUCTIVE: s/p prostate surgery, sounds like reduction for BPH, no hx prostate cancer. Stable at this time.   ENDOCRINE: DM2 controlled on Metformin. A1C as below indicates good control on current meds.   MUSCULOSKELETAL/RHEUM: L arm/hand pain, following with orthopedics.   HEENT: glaucoma and cataracts, stable, referred to ophtho locally and he has been established there .  Shoulder pain: R shoulder bothers him on occasiona, not today. Thinks may want to see an orthopedist for this.      Past medical, surgical, social and family history reviewed: Patient Active Problem List   Diagnosis Date Noted  . Trigger finger, acquired 04/26/2017  . Brachial plexopathy 04/18/2017  . Carpal tunnel syndrome of left wrist 04/18/2017  . Ulnar neuropathy at elbow of left upper extremity 04/18/2017  . S/P shoulder replacement, left 10/11/2016  . Situational depression 09/12/2016  . Left shoulder pain 08/23/2016  . OSA (obstructive sleep apnea) 07/14/2016  . Dysgeusia 07/13/2016  . Type 2 diabetes mellitus without complication ([place]) 85/12/7739  . Benign enlargement of prostate 10/28/2014  . Contracture of palmar fascia 03/18/2013  . Acquired cyst of kidney 01/31/2013  . Kidney stone 09/24/2012  . Benign essential hypertension 04/17/2012  . Mixed hyperlipidemia 04/17/2012  . Other specified glaucoma 04/17/2012    Social History   Tobacco Use  . Smoking status: Former Smoker    Last  attempt to quit: 11/14/1968    Years since quitting: 48.9  . Smokeless tobacco: Never Used  Substance Use Topics  . Alcohol use: Not on file   Family History  Problem Relation Age of Onset  . Cancer Mother   . Diabetes Mother   . Heart attack Sister      Current medication list and allergy/intolerance information reviewed:   Current Outpatient Medications  Medication Sig Dispense Refill  . amLODipine (NORVASC) 5 MG tablet Take 1 tablet (5 mg total) by mouth daily. 90 tablet 3  . aspirin EC 81 MG tablet Take 81 mg by mouth.    . DULoxetine (CYMBALTA) 20 MG capsule Take 1 capsule (20 mg total) by mouth daily. 90 capsule 0  . gabapentin (NEURONTIN) 300 MG capsule TAKE 1 CAPSULE BY MOUTH AT BEDTIME 30 capsule 0  . latanoprost (XALATAN) 0.005 % ophthalmic solution Administer 1 drop to both eyes daily.     Marland Kitchen lisinopril-hydrochlorothiazide (PRINZIDE,ZESTORETIC) 20-12.5 MG tablet Take 2 tablets by mouth daily. 180 tablet 3  . metFORMIN (GLUCOPHAGE) 500 MG tablet TAKE TWO TABLETS BY MOUTH TWICE DAILY WITH MEALS    . simvastatin (ZOCOR) 10 MG tablet Take 1 tablet (10 mg total) by mouth daily. 90 tablet 3  . timolol (TIMOPTIC) 0.5 % ophthalmic solution Administer 1 drop to both eyes Two (2) times a day.      No current facility-administered medications for this visit.    No Known Allergies    Review of Systems:  Constitutional:  No  fever, no chills, No recent illness,  HEENT: No  headache, no vision change  Cardiac: No  chest pain, No  pressure, No palpitations  Respiratory:  No  shortness of breath. No  Cough  Musculoskeletal: No new myalgia/arthralgia  Neurologic: No  weakness, No  dizziness  Psychiatric: No  concerns with depression, No  concerns with anxiety   Exam:  BP 135/65   Pulse (!) 55   Temp 98 F (36.7 C) (Oral)   Wt 185 lb 11.2 oz (84.2 kg)   BMI 26.65 kg/m   Constitutional: VS see above. General Appearance: alert, well-developed, well-nourished, NAD  Eyes:  Normal lids and conjunctive, non-icteric sclera  Ears, Nose, Mouth, Throat: MMM, Normal external inspection ears/nares/mouth/lips/gums.   Neck: No masses, trachea midline. No thyroid enlargement.   Respiratory: Normal respiratory effort. no wheeze, no rhonchi, no rales  Cardiovascular: S1/S2 normal, no murmur, no rub/gallop auscultated. RRR. No lower extremity edema.   Musculoskeletal: Gait normal. No clubbing/cyanosis of digits.   Neurological: Normal balance/coordination.  Skin: warm, dry, intact. No rash/ulcer.   Psychiatric: Normal judgment/insight. Normal mood and affect. Oriented x3.    Results for orders placed or performed in visit on 11/01/17 (from the past 72 hour(s))  POCT HgB A1C     Status: None   Collection Time: 11/01/17 10:40 AM  Result Value Ref Range   Hemoglobin A1C 7.3    Reviewed results in Care Everywhere, no major concerns on labs 01/2016     ASSESSMENT/PLAN:  Chronic conditions stable, follow up routinely for sugar recheck and as needed   Type 2 diabetes mellitus without complication, without long-term current use of insulin ([place]) - Plan: POCT HgB A1C  Benign essential hypertension  Chronic right shoulder pain    Patient Instructions  If shoulder pain is not better or if it gets worse, I would recommend follow-up with one of our sports medicine specialists (Dr Georgina Snell or Dr. Patton Salles Dr. Darene Lamer) for further evaluation. Just call our office and ask for an appointment for sports medicine!    Otherwise, let us plan to follow-up in March for routine labs/annual physical.  But if you would like to get blood work done at ahead of that visit so we can discuss results at the time of your appointment, please call the office a week prior to your visit so that we can ensure the orders are in place.    Visit summary with medication list and pertinent instructions was printed for patient to review. All questions at time of visit were answered - patient  instructed to contact office with any additional concerns. ER/RTC precautions were reviewed with the patient.   Follow-up plan: Return in about 3 months (around 01/30/2018) for [place] .

## 2017-11-01 NOTE — Patient Instructions (Signed)
If shoulder pain is not better or if it gets worse, I would recommend follow-up with one of our sports medicine specialists (Dr Georgina Snell or Dr. Patton Salles Dr. Darene Lamer) for further evaluation. Just call our office and ask for an appointment for sports medicine!    Otherwise, let us plan to follow-up in March for routine labs/annual physical.  But if you would like to get blood work done at ahead of that visit so we can discuss results at the time of your appointment, please call the office a week prior to your visit so that we can ensure the orders are in place.

## 2017-11-06 ENCOUNTER — Other Ambulatory Visit: Payer: Self-pay | Admitting: Osteopathic Medicine

## 2017-12-04 ENCOUNTER — Other Ambulatory Visit: Payer: Self-pay | Admitting: Osteopathic Medicine

## 2017-12-22 ENCOUNTER — Institutional Professional Consult (permissible substitution): Payer: PRIVATE HEALTH INSURANCE | Admitting: Emergency Medicine

## 2018-01-02 ENCOUNTER — Other Ambulatory Visit: Payer: Self-pay | Admitting: Osteopathic Medicine

## 2018-01-10 DIAGNOSIS — H401122 Primary open-angle glaucoma, left eye, moderate stage: Secondary | ICD-10-CM | POA: Diagnosis not present

## 2018-01-10 DIAGNOSIS — H25813 Combined forms of age-related cataract, bilateral: Secondary | ICD-10-CM | POA: Diagnosis not present

## 2018-01-10 DIAGNOSIS — H43811 Vitreous degeneration, right eye: Secondary | ICD-10-CM | POA: Diagnosis not present

## 2018-01-10 DIAGNOSIS — H02831 Dermatochalasis of right upper eyelid: Secondary | ICD-10-CM | POA: Diagnosis not present

## 2018-01-10 DIAGNOSIS — H527 Unspecified disorder of refraction: Secondary | ICD-10-CM | POA: Diagnosis not present

## 2018-01-10 DIAGNOSIS — H02834 Dermatochalasis of left upper eyelid: Secondary | ICD-10-CM | POA: Diagnosis not present

## 2018-01-10 DIAGNOSIS — H401111 Primary open-angle glaucoma, right eye, mild stage: Secondary | ICD-10-CM | POA: Diagnosis not present

## 2018-01-10 DIAGNOSIS — E119 Type 2 diabetes mellitus without complications: Secondary | ICD-10-CM | POA: Diagnosis not present

## 2018-01-12 DIAGNOSIS — H401122 Primary open-angle glaucoma, left eye, moderate stage: Secondary | ICD-10-CM | POA: Diagnosis not present

## 2018-01-12 DIAGNOSIS — H401111 Primary open-angle glaucoma, right eye, mild stage: Secondary | ICD-10-CM | POA: Diagnosis not present

## 2018-01-12 DIAGNOSIS — H25813 Combined forms of age-related cataract, bilateral: Secondary | ICD-10-CM | POA: Diagnosis not present

## 2018-01-16 DIAGNOSIS — E119 Type 2 diabetes mellitus without complications: Secondary | ICD-10-CM | POA: Diagnosis not present

## 2018-01-16 DIAGNOSIS — H25813 Combined forms of age-related cataract, bilateral: Secondary | ICD-10-CM | POA: Diagnosis not present

## 2018-01-16 DIAGNOSIS — H401111 Primary open-angle glaucoma, right eye, mild stage: Secondary | ICD-10-CM | POA: Diagnosis not present

## 2018-01-16 DIAGNOSIS — H25812 Combined forms of age-related cataract, left eye: Secondary | ICD-10-CM | POA: Diagnosis not present

## 2018-01-16 DIAGNOSIS — Z79899 Other long term (current) drug therapy: Secondary | ICD-10-CM | POA: Diagnosis not present

## 2018-01-16 DIAGNOSIS — Z87891 Personal history of nicotine dependence: Secondary | ICD-10-CM | POA: Diagnosis not present

## 2018-01-16 DIAGNOSIS — Z7984 Long term (current) use of oral hypoglycemic drugs: Secondary | ICD-10-CM | POA: Diagnosis not present

## 2018-01-16 DIAGNOSIS — Z833 Family history of diabetes mellitus: Secondary | ICD-10-CM | POA: Diagnosis not present

## 2018-01-16 DIAGNOSIS — Z83518 Family history of other specified eye disorder: Secondary | ICD-10-CM | POA: Diagnosis not present

## 2018-01-16 DIAGNOSIS — H401122 Primary open-angle glaucoma, left eye, moderate stage: Secondary | ICD-10-CM | POA: Diagnosis not present

## 2018-01-16 DIAGNOSIS — G4730 Sleep apnea, unspecified: Secondary | ICD-10-CM | POA: Diagnosis not present

## 2018-01-16 DIAGNOSIS — E785 Hyperlipidemia, unspecified: Secondary | ICD-10-CM | POA: Diagnosis not present

## 2018-01-16 DIAGNOSIS — I10 Essential (primary) hypertension: Secondary | ICD-10-CM | POA: Diagnosis not present

## 2018-01-17 DIAGNOSIS — Z961 Presence of intraocular lens: Secondary | ICD-10-CM | POA: Diagnosis not present

## 2018-01-17 DIAGNOSIS — H527 Unspecified disorder of refraction: Secondary | ICD-10-CM | POA: Diagnosis not present

## 2018-01-17 DIAGNOSIS — H401122 Primary open-angle glaucoma, left eye, moderate stage: Secondary | ICD-10-CM | POA: Diagnosis not present

## 2018-01-17 DIAGNOSIS — H401111 Primary open-angle glaucoma, right eye, mild stage: Secondary | ICD-10-CM | POA: Diagnosis not present

## 2018-01-17 DIAGNOSIS — H02831 Dermatochalasis of right upper eyelid: Secondary | ICD-10-CM | POA: Diagnosis not present

## 2018-01-17 DIAGNOSIS — H43811 Vitreous degeneration, right eye: Secondary | ICD-10-CM | POA: Diagnosis not present

## 2018-01-17 DIAGNOSIS — H02834 Dermatochalasis of left upper eyelid: Secondary | ICD-10-CM | POA: Diagnosis not present

## 2018-01-17 DIAGNOSIS — H25811 Combined forms of age-related cataract, right eye: Secondary | ICD-10-CM | POA: Diagnosis not present

## 2018-01-17 DIAGNOSIS — E119 Type 2 diabetes mellitus without complications: Secondary | ICD-10-CM | POA: Diagnosis not present

## 2018-01-25 ENCOUNTER — Institutional Professional Consult (permissible substitution): Payer: PRIVATE HEALTH INSURANCE | Admitting: Pulmonary Disease

## 2018-01-30 ENCOUNTER — Ambulatory Visit (INDEPENDENT_AMBULATORY_CARE_PROVIDER_SITE_OTHER): Payer: Medicare Other | Admitting: Osteopathic Medicine

## 2018-01-30 ENCOUNTER — Encounter: Payer: Self-pay | Admitting: Osteopathic Medicine

## 2018-01-30 VITALS — BP 133/65 | HR 60 | Temp 98.0°F | Wt 187.1 lb

## 2018-01-30 DIAGNOSIS — E119 Type 2 diabetes mellitus without complications: Secondary | ICD-10-CM | POA: Diagnosis not present

## 2018-01-30 DIAGNOSIS — Z0000 Encounter for general adult medical examination without abnormal findings: Secondary | ICD-10-CM

## 2018-01-30 DIAGNOSIS — I10 Essential (primary) hypertension: Secondary | ICD-10-CM

## 2018-01-30 NOTE — Progress Notes (Signed)
Subjective:   Levi Cochran is a 77 y.o. male who presents for Medicare Annual/Subsequent preventive examination.  Review of Systems:  No concerns today        Objective:    Vitals: BP 133/65 (BP Location: Left Arm)   Pulse 60   Temp 98 F (36.7 C) (Oral)   Wt 187 lb 1.9 oz (84.9 kg)   BMI 26.85 kg/m   Body mass index is 26.85 kg/m.  Advanced Directives 08/02/2017  Does Patient Have a Medical Advance Directive? Yes  Type of Advance Directive Living will;Healthcare Power of [place] in Chart? No - copy requested    Tobacco Social History   Tobacco Use  Smoking Status Former Smoker  . Last attempt to quit: 11/14/1968  . Years since quitting: 49.2  Smokeless Tobacco Never Used     Counseling given: Not Answered   Clinical Intake:                       Past Medical History:  Diagnosis Date  . Diabetes mellitus without complication ([place])   . Hyperlipidemia   . Hypertension    Past Surgical History:  Procedure Laterality Date  . BACK SURGERY    . HAND SURGERY    . PROSTATE SURGERY    . TONSILLECTOMY     Family History  Problem Relation Age of Onset  . Cancer Mother   . Diabetes Mother   . Heart attack Sister    Social History   Socioeconomic History  . Marital status: Married    Spouse name: None  . Number of children: None  . Years of education: None  . Highest education level: None  Social Needs  . Financial resource strain: None  . Food insecurity - worry: None  . Food insecurity - inability: None  . Transportation needs - medical: None  . Transportation needs - non-medical: None  Occupational History  . None  Tobacco Use  . Smoking status: Former Smoker    Last attempt to quit: 11/14/1968    Years since quitting: 49.2  . Smokeless tobacco: Never Used  Substance and Sexual Activity  . Alcohol use: None  . Drug use: None  . Sexual activity: None  Other Topics Concern  . None  Social  History Narrative  . None    Outpatient Encounter Medications as of 01/30/2018  Medication Sig  . amLODipine (NORVASC) 5 MG tablet Take 1 tablet (5 mg total) by mouth daily.  Marland Kitchen aspirin EC 81 MG tablet Take 81 mg by mouth.  . DULoxetine (CYMBALTA) 20 MG capsule TAKE 1 CAPSULE BY MOUTH ONCE DAILY  . gabapentin (NEURONTIN) 300 MG capsule TAKE 1 CAPSULE BY MOUTH AT BEDTIME  . latanoprost (XALATAN) 0.005 % ophthalmic solution Administer 1 drop to both eyes daily.   Marland Kitchen lisinopril-hydrochlorothiazide (PRINZIDE,ZESTORETIC) 20-12.5 MG tablet Take 2 tablets by mouth daily.  . metFORMIN (GLUCOPHAGE) 500 MG tablet TAKE TWO TABLETS BY MOUTH TWICE DAILY WITH MEALS  . simvastatin (ZOCOR) 10 MG tablet Take 1 tablet (10 mg total) by mouth daily.  . timolol (TIMOPTIC) 0.5 % ophthalmic solution Administer 1 drop to both eyes Two (2) times a day.    No facility-administered encounter medications on file as of 01/30/2018.     Activities of Daily Living No flowsheet data found.  Patient Care Team: Emeterio Reeve, DO as PCP - General (Osteopathic Medicine)   Assessment:   This is a routine  wellness examination for Levi Cochran.  Exercise Activities and Dietary recommendations    Goals    None      Fall Risk Fall Risk  01/30/2018 08/02/2017  Falls in the past year? No No   Is the patient's home free of loose throw rugs in walkways, pet beds, electrical cords, etc?   yes      Grab bars in the bathroom? no      Handrails on the stairs?   yes      Adequate lighting?   yes  Timed Get Up and Go Performed: normal  Depression Screen PHQ 2/9 Scores 01/30/2018 08/02/2017  PHQ - 2 Score 0 1    Cognitive Function     6CIT Screen 01/30/2018  What Year? 0 points  What month? 0 points  What time? 0 points  Count back from 20 0 points  Repeat phrase 0 points    Immunization History  Administered Date(s) Administered  . Influenza, High Dose Seasonal PF 08/02/2017  . Influenza,inj,Quad PF,6+ Mos  08/10/2016  . Influenza-Unspecified 10/04/2014, 09/29/2015  . Pneumococcal Conjugate-13 07/13/2016  . Pneumococcal Polysaccharide-23 08/02/2017  . Pneumococcal-Unspecified 10/04/2014  . Tdap 08/02/2017    Qualifies for Shingles Vaccine? Yes, declined today   Screening Tests Health Maintenance  Topic Date Due  . FOOT EXAM  02/12/2018 (Originally 08/03/1951)  . HEMOGLOBIN A1C  05/02/2018  . OPHTHALMOLOGY EXAM  10/02/2018  . TETANUS/TDAP  08/03/2027  . INFLUENZA VACCINE  Completed  . PNA vac Low Risk Adult  Completed   Cancer Screenings: Lung: Low Dose CT Chest recommended if Age 54-80 years, 30 pack-year currently smoking OR have quit w/in 15years. Patient does not qualify. Colorectal: no longer candidate, he reports previous screening normal  Additional Screenings: does not need Hepatitis B/HIV/Syphillis: Hepatitis C Screening:    MALE PREVENTIVE CARE  updated 01/30/18  ANNUAL SCREENING/COUNSELING  Any changes to health in the past year? no  Diet/Exercise - HEALTHY HABITS DISCUSSED TO DECREASE CV RISK Social History   Tobacco Use  Smoking Status Former Smoker  . Last attempt to quit: 11/14/1968  . Years since quitting: 49.2  Smokeless Tobacco Never Used   Social History   Substance and Sexual Activity  Alcohol Use Not on file   Depression screen [facility] 2/9 01/30/2018  Decreased Interest 0  Down, Depressed, Hopeless 0  PHQ - 2 Score 0    SEXUAL/REPRODUCTIVE HEALTH  Sexually active in the past year? - Yes with male.  STI testing needed/desired today? - no  Any concerns with testosterone/libido? - no  INFECTIOUS DISEASE SCREENING  HIV - does not need  GC/CT - does not need  HepC - does not need  TB - does not need  CANCER SCREENING  Lung - USPSTF: 55-80yo w/ 30 py hx unless quit w/in 44yr - does not need  Colon - does not need  Prostate - does not need  OTHER DISEASE SCREENING  Lipid - needs  DM2 - does not need  AAA - 65-75yo ever  smoked: does not need   ADULT VACCINATION  Influenza - annual vaccine recommended  Td - booster every 10 years   Zoster - Shingrix recommended 6+ years old  PCV13 - was not indicated  PPSV23 - was not indicated Immunization History  Administered Date(s) Administered  . Influenza, High Dose Seasonal PF 08/02/2017  . Influenza,inj,Quad PF,6+ Mos 08/10/2016  . Influenza-Unspecified 10/04/2014, 09/29/2015  . Pneumococcal Conjugate-13 07/13/2016  . Pneumococcal Polysaccharide-23 08/02/2017  . Pneumococcal-Unspecified 10/04/2014  .  Tdap 08/02/2017            Plan:     Medicare annual wellness visit, subsequent  Type 2 diabetes mellitus without complication, without long-term current use of insulin ([place]) - Plan: Hemoglobin A1c  Benign essential hypertension - Plan: CBC, COMPLETE METABOLIC PANEL WITH GFR, Lipid panel    I have personally reviewed and noted the following in the patient's chart:   . Medical and social history . Use of alcohol, tobacco or illicit drugs  . Current medications and supplements . Functional ability and status . Nutritional status . Physical activity . Advanced directives . List of other physicians . Hospitalizations, surgeries, and ER visits in previous 12 months . Vitals . Screenings to include cognitive, depression, and falls . Referrals and appointments  In addition, I have reviewed and discussed with patient certain preventive protocols, quality metrics, and best practice recommendations. A written personalized care plan for preventive services as well as general preventive health recommendations were provided to patient.     Emeterio Reeve, DO  01/30/2018

## 2018-01-30 NOTE — Patient Instructions (Signed)
  [name] , Thank you for taking time to come for your Medicare Wellness Visit. I appreciate your ongoing commitment to your health goals. Please review the following plan we discussed and let me know if I can assist you in the future.   These are the goals: Goals    Work on diabetic diet and increased exercise/activity Consider shingles vaccine       This is a list of the screening recommended for you and due dates:  Health Maintenance  Topic Date Due  . Complete foot exam  (will get next visit) 02/12/2018  . Hemoglobin A1C (will get with labs)  05/02/2018  . Eye exam for diabetics  10/02/2018  . Tetanus Vaccine  08/03/2027  . Flu Shot  Completed  . Pneumonia vaccines  Completed  *Topic was postponed. The date shown is not the original due date.

## 2018-02-05 ENCOUNTER — Other Ambulatory Visit: Payer: Self-pay | Admitting: Osteopathic Medicine

## 2018-02-05 DIAGNOSIS — E119 Type 2 diabetes mellitus without complications: Secondary | ICD-10-CM | POA: Diagnosis not present

## 2018-02-05 DIAGNOSIS — I10 Essential (primary) hypertension: Secondary | ICD-10-CM | POA: Diagnosis not present

## 2018-02-06 LAB — COMPLETE METABOLIC PANEL WITH GFR
AG RATIO: 1.8 (calc) (ref 1.0–2.5)
ALT: 10 U/L (ref 9–46)
AST: 17 U/L (ref 10–35)
Albumin: 4.2 g/dL (ref 3.6–5.1)
Alkaline phosphatase (APISO): 71 U/L (ref 40–115)
BILIRUBIN TOTAL: 0.5 mg/dL (ref 0.2–1.2)
BUN/Creatinine Ratio: 28 (calc) — ABNORMAL HIGH (ref 6–22)
BUN: 37 mg/dL — ABNORMAL HIGH (ref 7–25)
CALCIUM: 9.4 mg/dL (ref 8.6–10.3)
CHLORIDE: 104 mmol/L (ref 98–110)
CO2: 25 mmol/L (ref 20–32)
Creat: 1.3 mg/dL — ABNORMAL HIGH (ref 0.70–1.18)
GFR, Est African American: 61 mL/min/{1.73_m2} (ref >=60)
GFR, Est Non African American: 53 mL/min/{1.73_m2} — ABNORMAL LOW (ref >=60)
Globulin: 2.3 g/dL (calc) (ref 1.9–3.7)
Glucose, Bld: 208 mg/dL — ABNORMAL HIGH (ref 65–99)
POTASSIUM: 4.7 mmol/L (ref 3.5–5.3)
Sodium: 138 mmol/L (ref 135–146)
Total Protein: 6.5 g/dL (ref 6.1–8.1)

## 2018-02-06 LAB — HEMOGLOBIN A1C
HEMOGLOBIN A1C: 7.2 %{Hb} — AB (ref <5.7)
Mean Plasma Glucose: 160 (calc)
eAG (mmol/L): 8.9 (calc)

## 2018-02-06 LAB — LIPID PANEL
CHOLESTEROL: 147 mg/dL (ref <200)
HDL: 46 mg/dL (ref >40)
LDL Cholesterol (Calc): 77 mg/dL (calc)
Non-HDL Cholesterol (Calc): 101 mg/dL (calc) (ref <130)
TRIGLYCERIDES: 138 mg/dL (ref <150)
Total CHOL/HDL Ratio: 3.2 (calc) (ref <5.0)

## 2018-02-06 LAB — CBC
HCT: 38.8 % (ref 38.5–50.0)
Hemoglobin: 12.9 g/dL — ABNORMAL LOW (ref 13.2–17.1)
MCH: 29 pg (ref 27.0–33.0)
MCHC: 33.2 g/dL (ref 32.0–36.0)
MCV: 87.2 fL (ref 80.0–100.0)
MPV: 11.7 fL (ref 7.5–12.5)
PLATELETS: 209 10*3/uL (ref 140–400)
RBC: 4.45 10*6/uL (ref 4.20–5.80)
RDW: 12.8 % (ref 11.0–15.0)
WBC: 6.8 10*3/uL (ref 3.8–10.8)

## 2018-02-13 DIAGNOSIS — H401122 Primary open-angle glaucoma, left eye, moderate stage: Secondary | ICD-10-CM | POA: Diagnosis not present

## 2018-02-13 DIAGNOSIS — Z961 Presence of intraocular lens: Secondary | ICD-10-CM | POA: Diagnosis not present

## 2018-03-05 ENCOUNTER — Encounter: Payer: Self-pay | Admitting: Osteopathic Medicine

## 2018-03-05 ENCOUNTER — Ambulatory Visit (INDEPENDENT_AMBULATORY_CARE_PROVIDER_SITE_OTHER): Payer: Medicare Other | Admitting: Osteopathic Medicine

## 2018-03-05 VITALS — BP 122/73 | HR 74 | Temp 98.5°F | Wt 183.1 lb

## 2018-03-05 DIAGNOSIS — R300 Dysuria: Secondary | ICD-10-CM

## 2018-03-05 DIAGNOSIS — N390 Urinary tract infection, site not specified: Secondary | ICD-10-CM

## 2018-03-05 LAB — POCT URINALYSIS DIPSTICK
Glucose, UA: NEGATIVE
Ketones, UA: NEGATIVE
Nitrite, UA: NEGATIVE
PH UA: 5.5 (ref 5.0–8.0)
PROTEIN UA: 100
Spec Grav, UA: >=1.03 — AB (ref 1.010–1.025)
UROBILINOGEN UA: 4 U/dL — AB

## 2018-03-05 MED ORDER — DULOXETINE HCL 20 MG PO CPEP: 20.0000 mg | ORAL_CAPSULE | Freq: Every day | ORAL | 3 refills | Status: AC

## 2018-03-05 MED ORDER — CIPROFLOXACIN HCL 500 MG PO TABS: 500.0000 mg | ORAL_TABLET | Freq: Two times a day (BID) | ORAL | 0 refills | Status: AC

## 2018-03-05 MED ORDER — GABAPENTIN 300 MG PO CAPS: 300.0000 mg | ORAL_CAPSULE | Freq: Every day | ORAL | 3 refills | Status: DC

## 2018-03-05 MED ORDER — CEFTRIAXONE SODIUM 1 G IJ SOLR
1.0000 g | Freq: Once | INTRAMUSCULAR | Status: AC
  Administered 2018-03-05: 1 g via INTRAMUSCULAR

## 2018-03-05 NOTE — Patient Instructions (Signed)
Plan:  Antibiotic shot now  Oral antibiotics at home  To ER if fever/nausea, worsening pain, or confusion

## 2018-03-05 NOTE — Progress Notes (Signed)
HPI: Levi Cochran is a 77 y.o. male who  has a past medical history of Diabetes mellitus without complication ([place]), Hyperlipidemia, and Hypertension.  he presents to [facility] today, 03/05/18,  for chief complaint of:  Urinary Issue Concern for kidney infection or stones   Burning, discomfort and frequency of urination x3 days. He is concerned about infection or stones. No fever. Doesn't feel quite like previous renal stone episodes, feels more to him ike an infection he had a few years ago.   Patient is accompanied by wife who assists with history-taking.    Past medical history, surgical history, social history and family history reviewed.   Current medication list and allergy/intolerance information reviewed.    Current Outpatient Medications on File Prior to Visit  Medication Sig Dispense Refill  . amLODipine (NORVASC) 5 MG tablet Take 1 tablet (5 mg total) by mouth daily. 90 tablet 3  . aspirin EC 81 MG tablet Take 81 mg by mouth.    . DULoxetine (CYMBALTA) 20 MG capsule TAKE 1 CAPSULE BY MOUTH ONCE DAILY 90 capsule 0  . gabapentin (NEURONTIN) 300 MG capsule TAKE 1 CAPSULE BY MOUTH AT BEDTIME 30 capsule 0  . latanoprost (XALATAN) 0.005 % ophthalmic solution Administer 1 drop to both eyes daily.     Marland Kitchen lisinopril-hydrochlorothiazide (PRINZIDE,ZESTORETIC) 20-12.5 MG tablet Take 2 tablets by mouth daily. 180 tablet 3  . metFORMIN (GLUCOPHAGE) 500 MG tablet TAKE TWO TABLETS BY MOUTH TWICE DAILY WITH MEALS    . simvastatin (ZOCOR) 10 MG tablet Take 1 tablet (10 mg total) by mouth daily. 90 tablet 3  . timolol (TIMOPTIC) 0.5 % ophthalmic solution Administer 1 drop to both eyes Two (2) times a day.      No current facility-administered medications on file prior to visit.    No Known Allergies    Review of Systems:  Constitutional: No recent illness, +fatigue  HEENT: No  headache, no vision change  Cardiac: No  chest pain, No  pressure, No  palpitations  Respiratory:  No  shortness of breath. No  Cough  Gastrointestinal: No  abdominal pain, no change on bowel habits, no nausea  GU: see HPI  Neurologic: No  weakness, No  Dizziness   Exam:  BP 122/73 (BP Location: Left Arm, Patient Position: Sitting, Cuff Size: Normal)   Pulse 74   Temp 98.5 F (36.9 C) (Oral)   Wt 183 lb 1.3 oz (83 kg)   BMI 26.27 kg/m   Constitutional: VS see above. General Appearance: alert, well-developed, well-nourished, NAD  Eyes: Normal lids and conjunctive, non-icteric sclera  Ears, Nose, Mouth, Throat: MMM, Normal external inspection ears/nares/mouth/lips/gums.  Neck: No masses, trachea midline.   Respiratory: Normal respiratory effort. no wheeze, no rhonchi, no rales  Cardiovascular: S1/S2 normal, no murmur, no rub/gallop auscultated. RRR.   Musculoskeletal: Gait normal. Symmetric and independent movement of all extremities. Neh lloyd's sign bilaterally  Abdominal: nontender, BS wnl x4  Neurological: Normal balance/coordination. No tremor.  Skin: warm, dry, intact.   Psychiatric: Normal judgment/insight. Normal mood and affect. Oriented x3.     Results for orders placed or performed in visit on 03/05/18 (from the past 24 hour(s))  POCT Urinalysis Dipstick     Status: Abnormal   Collection Time: 03/05/18 11:16 AM  Result Value Ref Range   Color, UA YELLOW    Clarity, UA CLEAR    Glucose, UA NEGATIVE    Bilirubin, UA SMALL    Ketones, UA NEGATIVE  Spec Grav, UA >=1.030 (A) 1.010 - 1.025   Blood, UA TRACE-INTACT    pH, UA 5.5 5.0 - 8.0   Protein, UA 100    Urobilinogen, UA 4.0 (A) 0.2 or 1.0 E.U./dL   Nitrite, UA NEGATIVE    Leukocytes, UA Trace (A) Negative   Appearance     Odor       ASSESSMENT/PLAN:   Complicated UTI (urinary tract infection) - Plan: cefTRIAXone (ROCEPHIN) injection 1 g  Dysuria - Plan: POCT Urinalysis Dipstick, Urine Culture, Urinalysis, microscopic only   Meds ordered this encounter   Medications  . gabapentin (NEURONTIN) 300 MG capsule    Sig: Take 1 capsule (300 mg total) by mouth at bedtime.    Dispense:  90 capsule    Refill:  3    Please consider 90 day supplies to promote better adherence  . DULoxetine (CYMBALTA) 20 MG capsule    Sig: Take 1 capsule (20 mg total) by mouth daily.    Dispense:  90 capsule    Refill:  3  . ciprofloxacin (CIPRO) 500 MG tablet    Sig: Take 1 tablet (500 mg total) by mouth 2 (two) times daily for 7 days.    Dispense:  14 tablet    Refill:  0  . cefTRIAXone (ROCEPHIN) injection 1 g    Order Specific Question:   Antibiotic Indication:    Answer:   UTI    Patient Instructions  Plan:  Antibiotic shot now  Oral antibiotics at home  To ER if fever/nausea, worsening pain, or confusion     Follow-up plan: Return if symptoms worsen or fail to improve.  Visit summary with medication list and pertinent instructions was printed for patient to review, alert [place] if any changes needed. All questions at time of visit were answered - patient instructed to contact office with any additional concerns. ER/RTC precautions were reviewed with the patient and understanding verbalized.      Please note: voice recognition software was used to produce this document, and typos may escape review. Please contact Dr. Sheppard Coil for any needed clarifications.

## 2018-03-06 ENCOUNTER — Ambulatory Visit (INDEPENDENT_AMBULATORY_CARE_PROVIDER_SITE_OTHER): Payer: Medicare Other | Admitting: Pulmonary Disease

## 2018-03-06 ENCOUNTER — Encounter: Payer: Self-pay | Admitting: Pulmonary Disease

## 2018-03-06 VITALS — BP 120/80 | HR 66 | Ht 70.0 in | Wt 182.8 lb

## 2018-03-06 DIAGNOSIS — G4730 Sleep apnea, unspecified: Secondary | ICD-10-CM | POA: Diagnosis not present

## 2018-03-06 DIAGNOSIS — G4731 Primary central sleep apnea: Secondary | ICD-10-CM

## 2018-03-06 DIAGNOSIS — G4739 Other sleep apnea: Secondary | ICD-10-CM

## 2018-03-06 LAB — URINALYSIS, MICROSCOPIC ONLY
SQUAMOUS EPITHELIAL / LPF: NONE SEEN /HPF (ref <=5)
WBC, UA: >=60 /HPF — AB (ref 0–5)

## 2018-03-06 NOTE — Patient Instructions (Signed)
Will get copy of your sleeps studies  Will arrange for referral to [facility] to get sleep machine supplies set up  Follow up in 1 year

## 2018-03-06 NOTE — Progress Notes (Signed)
Levi Cochran, Critical Care, and Sleep Medicine  Chief Complaint  Patient presents with  . Sleep Consult    Pt referred by Dr. Leonides Schanz MD. Pt snores loudly, not sleeping through the night wakes up 2-3 times.     Vital signs: BP 120/80 (BP Location: Left Arm, Cuff Size: Normal)   Pulse 66   Ht 5\' 10"  (1.778 m)   Wt 182 lb 12.8 oz (82.9 kg)   SpO2 96%   BMI 26.23 kg/m   History of Present Illness: Levi Cochran is a 77 y.o. male for evaluation of sleep problems.  He was living in [place], [place].   He had sleep study in 2017.  Found to have severe sleep apnea.  Had CPAP titration.  Failed CPAP and Bipap due to persistent central apneas.  Was started on ASV.  Uses nightly.  No issues with mask fit.  Feels this helps his sleep.  He goes to sleep at 11 pm.  He falls asleep in 30 minutes.  He wakes up some times to use the bathroom.  He gets out of bed at 7 am.  He feels rested in the morning.  He denies morning headache.  He does not use anything to help him fall sleep or stay awake.  He denies sleep walking, sleep talking, bruxism, or nightmares.  There is no history of restless legs.  He denies sleep hallucinations, sleep paralysis, or cataplexy.  The Epworth score is 17 out of 24.  Physical Exam:  General - pleasant Eyes - pupils reactive ENT - no sinus tenderness, no oral exudate, no LAN, wears dentures Cardiac - regular, no murmur Chest - no wheeze, rales Abd - soft, non tender Ext - no edema, contracted 2nd/3rd fingers on Rt hand Skin - no rashes Neuro - normal strength Psych - normal mood   Assessment/Plan:  Sleep apnea unspecified, treatment emergent central apnea, complex sleep apnea. - he is compliant with therapy and reports benefit - continue ASV - will arrange for new DME   Patient Instructions  Will get copy of your sleeps studies  Will arrange for referral to [facility] to get sleep machine supplies set up  Follow up in 1  year    Levi Mires, MD [place] 03/06/2018, 12:11 PM  Flow Sheet  Sleep tests: PSG 04/25/16 >> AHI 31  Review of Systems: Constitutional: Negative for fever and unexpected weight change.  HENT: Negative for congestion, dental problem, ear pain, nosebleeds, postnasal drip, rhinorrhea, sinus pressure, sneezing, sore throat and trouble swallowing.   Eyes: Negative for redness and itching.  Respiratory: Negative for cough, chest tightness, shortness of breath and wheezing.   Cardiovascular: Negative for palpitations and leg swelling.  Gastrointestinal: Negative for nausea and vomiting.  Genitourinary: Negative for dysuria.  Musculoskeletal: Negative for joint swelling.  Skin: Negative for rash.  Allergic/Immunologic: Negative.  Negative for environmental allergies, food allergies and immunocompromised state.  Neurological: Negative for headaches.  Hematological: Does not bruise/bleed easily.  Psychiatric/Behavioral: Negative for dysphoric mood. The patient is nervous/anxious.    Past Medical History: He  has a past medical history of Diabetes mellitus without complication ([place]), Hyperlipidemia, and Hypertension.  Past Surgical History: He  has a past surgical history that includes Prostate surgery; Back surgery; Hand surgery; and Tonsillectomy.  Family History: His family history includes Cancer in his mother; Diabetes in his mother; Heart attack in his sister.  Social History: He  reports that he quit smoking about 49 years ago.  He has never used smokeless tobacco. He reports that he does not drink alcohol or use drugs.  Medications: Allergies as of 03/06/2018   No Known Allergies     Medication List        Accurate as of 03/06/18 12:11 PM. Always use your most recent med list.          amLODipine 5 MG tablet Commonly known as:  NORVASC Take 1 tablet (5 mg total) by mouth daily.   aspirin EC 81 MG tablet Take 81 mg by mouth.   ciprofloxacin  500 MG tablet Commonly known as:  CIPRO Take 1 tablet (500 mg total) by mouth 2 (two) times daily for 7 days.   DULoxetine 20 MG capsule Commonly known as:  CYMBALTA Take 1 capsule (20 mg total) by mouth daily.   gabapentin 300 MG capsule Commonly known as:  NEURONTIN Take 1 capsule (300 mg total) by mouth at bedtime.   latanoprost 0.005 % ophthalmic solution Commonly known as:  XALATAN Administer 1 drop to both eyes daily.   lisinopril-hydrochlorothiazide 20-12.5 MG tablet Commonly known as:  PRINZIDE,ZESTORETIC Take 2 tablets by mouth daily.   metFORMIN 500 MG tablet Commonly known as:  GLUCOPHAGE TAKE TWO TABLETS BY MOUTH TWICE DAILY WITH MEALS   simvastatin 10 MG tablet Commonly known as:  ZOCOR Take 1 tablet (10 mg total) by mouth daily.   timolol 0.5 % ophthalmic solution Commonly known as:  TIMOPTIC Administer 1 drop to both eyes Two (2) times a day.

## 2018-03-06 NOTE — Progress Notes (Signed)
   Subjective:    Patient ID: Levi Cochran, male    DOB: [birth year], 77 y.o.   MRN: [id number]  HPI    Review of Systems  Constitutional: Negative for fever and unexpected weight change.  HENT: Negative for congestion, dental problem, ear pain, nosebleeds, postnasal drip, rhinorrhea, sinus pressure, sneezing, sore throat and trouble swallowing.   Eyes: Negative for redness and itching.  Respiratory: Negative for cough, chest tightness, shortness of breath and wheezing.   Cardiovascular: Negative for palpitations and leg swelling.  Gastrointestinal: Negative for nausea and vomiting.  Genitourinary: Negative for dysuria.  Musculoskeletal: Negative for joint swelling.  Skin: Negative for rash.  Allergic/Immunologic: Negative.  Negative for environmental allergies, food allergies and immunocompromised state.  Neurological: Negative for headaches.  Hematological: Does not bruise/bleed easily.  Psychiatric/Behavioral: Negative for dysphoric mood. The patient is nervous/anxious.        Objective:   Physical Exam        Assessment & Plan:

## 2018-03-07 LAB — URINE CULTURE
MICRO NUMBER:: 90490339
SPECIMEN QUALITY: ADEQUATE

## 2018-03-07 NOTE — Addendum Note (Signed)
Addended by: Maryla Morrow on: 03/07/2018 04:47 PM   Modules accepted: Orders

## 2018-03-08 ENCOUNTER — Telehealth: Payer: Self-pay | Admitting: Pulmonary Disease

## 2018-03-08 NOTE — Telephone Encounter (Signed)
ATC pt, no answer. Left message for pt to call back.  I do not see a sleep study but the study was supposed to be requested from another facility. I will await call back to get the same of the facility so I can call to follow up.   I spoke with [facility] verbally and she stated she sent off a release to get his sleep study yesterday so she will await the fax and follow up on this in a couple of days.   Will route to [facility]

## 2018-03-09 ENCOUNTER — Telehealth: Payer: Self-pay | Admitting: Pulmonary Disease

## 2018-03-09 DIAGNOSIS — G4730 Sleep apnea, unspecified: Secondary | ICD-10-CM

## 2018-03-09 NOTE — Telephone Encounter (Signed)
Order placed. Nothing further is needed.  

## 2018-03-09 NOTE — Telephone Encounter (Signed)
Spoke with Levi Cochran, [place] not yet received Will route back to [facility] for continued follow up

## 2018-03-12 NOTE — Telephone Encounter (Signed)
Could not locate HST results on this pt in VS' look-at or front folder.   Attempted to find request form but could not locate this.  Levi Cochran is off today, will await Kelli's return to see who needs to be contacted re: HST results.

## 2018-03-13 ENCOUNTER — Telehealth: Payer: Self-pay | Admitting: Pulmonary Disease

## 2018-03-13 NOTE — Telephone Encounter (Signed)
Found release form. Spoke with Sharyn Lull at [facility]. She stated that they did have the patient's sleep study on file. Advised her I will re-fax the medical release form. Gave her our fax number.   Will go ahead and re-send fax.

## 2018-03-13 NOTE — Telephone Encounter (Signed)
Left message for patient to see if he remembers where he had his HST done back in 2017.

## 2018-03-13 NOTE — Telephone Encounter (Signed)
Pt wife is calling back [phone]

## 2018-03-13 NOTE — Telephone Encounter (Signed)
Patients wife was calling to let us know that the patient had his sleep study done back in [facility] where he lived at the time. She states that she does not remember the name of the place but she will get this information and call us back.

## 2018-03-13 NOTE — Telephone Encounter (Signed)
Called patients wife unable to reach left message to give us a call back.  

## 2018-03-13 NOTE — Telephone Encounter (Signed)
Called and spoke with patients wife, she states patient had the sleep study done in [place] where he lived at the time. She does not remember the name of the facility. She is going to look it up and call us back.

## 2018-03-14 NOTE — Telephone Encounter (Signed)
rec'd copy of patient's sleep study today,  Placed in VS green folder in cubby for review

## 2018-03-15 NOTE — Telephone Encounter (Signed)
lmtcb for pt's wife- need to know name of sleep facility to request sleep test.

## 2018-03-19 NOTE — Telephone Encounter (Signed)
Called patients wife, unable to reach left message to give us a call back.  

## 2018-03-19 NOTE — Telephone Encounter (Signed)
Pt wife returning call. Per wife, the pt had a sleep study done at [facility] in [place], [place]. Phone number listed on [employer] is [phone]. Pt Cb is [phone].

## 2018-03-19 NOTE — Telephone Encounter (Signed)
Called and spoke to pt's spouse, Levi Cochran. Levi Cochran stated that pt's most recent sleep study was preformed at [facility].  I have contact [facility] and requested that sleep study be faxed to our office. Will leave message in triage until sleep study is received.

## 2018-03-20 DIAGNOSIS — R399 Unspecified symptoms and signs involving the genitourinary system: Secondary | ICD-10-CM | POA: Diagnosis not present

## 2018-03-20 DIAGNOSIS — Z87442 Personal history of urinary calculi: Secondary | ICD-10-CM | POA: Diagnosis not present

## 2018-03-20 DIAGNOSIS — N390 Urinary tract infection, site not specified: Secondary | ICD-10-CM | POA: Diagnosis not present

## 2018-03-20 NOTE — Telephone Encounter (Signed)
Sleep study has been received and placed in VS's look at.   Will send message to VS to make aware.

## 2018-03-29 NOTE — Telephone Encounter (Signed)
ASV titration 08/11/16 >> EPAP 9 to 11, PS 3-14   Please let him know that sleep study reviewed, but this was titration study.    Please check with his DME about whether he would need to get copy of his diagnostic study.

## 2018-03-30 NOTE — Telephone Encounter (Signed)
Called and spoke with pt's wife Benjamine Mola regarding in need of sleep study [facility] in [place], [place] were sleep study was completed [facility] records that we are in need of sleep diagnostic study;  Nell advised there is no diagnostic study found in her system on the patient. Routing message to VS for review.  VS please advise.

## 2018-03-30 NOTE — Telephone Encounter (Signed)
Can you check with his DME about what will be required to continue therapy.  Can he do home sleep study or will he need in lab study?

## 2018-03-30 NOTE — Telephone Encounter (Signed)
Called and spoke with Levi Cochran with [facility] regarding pt's cpap machine with them. Levi Cochran advised that pt was transferred to them on 03/09/2018 for cpap supplies. [facility] is not in need of any update sleep studies at this time per [facility]. He stated that [facility] has everything they need for this patient at this time to con't cpap therapy.  Routing message to VS for review

## 2018-03-30 NOTE — Telephone Encounter (Signed)
Great.  Thanks for follow up.

## 2018-04-20 ENCOUNTER — Encounter: Payer: Self-pay | Admitting: Family Medicine

## 2018-04-20 ENCOUNTER — Ambulatory Visit (INDEPENDENT_AMBULATORY_CARE_PROVIDER_SITE_OTHER): Payer: Medicare Other | Admitting: Family Medicine

## 2018-04-20 ENCOUNTER — Ambulatory Visit (INDEPENDENT_AMBULATORY_CARE_PROVIDER_SITE_OTHER): Payer: Medicare Other

## 2018-04-20 VITALS — BP 130/62 | HR 66 | Ht 70.0 in | Wt 185.0 lb

## 2018-04-20 DIAGNOSIS — M25511 Pain in right shoulder: Secondary | ICD-10-CM

## 2018-04-20 DIAGNOSIS — M7581 Other shoulder lesions, right shoulder: Secondary | ICD-10-CM | POA: Diagnosis not present

## 2018-04-20 NOTE — Patient Instructions (Signed)
Thank you for coming in today. Attend PT.  Recheck with me in 6 weeks.  Return sooner if needed.    Rotator Cuff Tendinitis Rotator cuff tendinitis is inflammation of the tough, cord-like bands that connect muscle to bone (tendons) in the rotator cuff. The rotator cuff includes all of the muscles and tendons that connect the arm to the shoulder. The rotator cuff holds the head of the upper arm bone (humerus) in the cup (fossa) of the shoulder blade (scapula). This condition can lead to a long-lasting (chronic) tear. The tear may be partial or complete. What are the causes? This condition is usually caused by overusing the rotator cuff. What increases the risk? This condition is more likely to develop in athletes and workers who frequently use their shoulder or reach over their heads. This can include activities such as:  Tennis.  Baseball or softball.  Swimming.  Construction work.  Painting.  What are the signs or symptoms? Symptoms of this condition include:  Pain spreading (radiating) from the shoulder to the upper arm.  Swelling and tenderness in front of the shoulder.  Pain when reaching, pulling, or lifting the arm above the head.  Pain when lowering the arm from above the head.  Minor pain in the shoulder when resting.  Increased pain in the shoulder at night.  Difficulty placing the arm behind the back.  How is this diagnosed? This condition is diagnosed with a medical history and physical exam. Tests may also be done, including:  X-rays.  MRI.  Ultrasounds.  CT or MR arthrogram. During this test, a contrast material is injected and then images are taken.  How is this treated? Treatment for this condition depends on the severity of the condition. In less severe cases, treatment may include:  Rest. This may be done with a sling that holds the shoulder still (immobilization). Your health care provider may also recommend avoiding activities that involve  lifting your arm over your head.  Icing the shoulder.  Anti-inflammatory medicines, such as aspirin or ibuprofen.  In more severe cases, treatment may include:  Physical therapy.  Steroid injections.  Surgery.  Follow these instructions at home: If you have a sling:  Wear the sling as told by your health care provider. Remove it only as told by your health care provider.  Loosen the sling if your fingers tingle, become numb, or turn cold and blue.  Keep the sling clean.  If the sling is not waterproof, do not let it get wet. Remove it, if allowed, or cover it with a watertight covering when you take a bath or shower. Managing pain, stiffness, and swelling  If directed, put ice on the injured area. ? If you have a removable sling, remove it as told by your health care provider. ? Put ice in a plastic bag. ? Place a towel between your skin and the bag. ? Leave the ice on for 20 minutes, 2-3 times a day.  Move your fingers often to avoid stiffness and to lessen swelling.  Raise (elevate) the injured area above the level of your heart while you are lying down.  Find a comfortable sleeping position or sleep on a recliner, if available. Driving  Do not drive or use heavy machinery while taking prescription pain medicine.  Ask your health care provider when it is safe to drive if you have a sling on your arm. Activity  Rest your shoulder as told by your health care provider.  Return to your  normal activities as told by your health care provider. Ask your health care provider what activities are safe for you.  Do any exercises or stretches as told by your health care provider.  If you do repetitive overhead tasks, take small breaks in between and include stretching exercises as told by your health care provider. General instructions  Do not use any products that contain nicotine or tobacco, such as cigarettes and e-cigarettes. These can delay healing. If you need help  quitting, ask your health care provider.  Take over-the-counter and prescription medicines only as told by your health care provider.  Keep all follow-up visits as told by your health care provider. This is important. Contact a health care provider if:  Your pain gets worse.  You have new pain in your arm, hands, or fingers.  Your pain is not relieved with medicine or does not get better after 6 weeks of treatment.  You have cracking sensations when moving your shoulder in certain directions.  You hear a snapping sound after using your shoulder, followed by severe pain and weakness. Get help right away if:  Your arm, hand, or fingers are numb or tingling.  Your arm, hand, or fingers are swollen or painful or they turn white or blue. Summary  Rotator cuff tendinitis is inflammation of the tough, cord-like bands that connect muscle to bone (tendons) in the rotator cuff.  This condition is usually caused by overusing the rotator cuff, which includes all of the muscles and tendons that connect the arm to the shoulder.  This condition is more likely to develop in athletes and workers who frequently use their shoulder or reach over their heads.  Treatment generally includes rest, anti-inflammatory medicines, and icing. In some cases, physical therapy and steroid injections may be needed. In severe cases, surgery may be needed. This information is not intended to replace advice given to you by your health care provider. Make sure you discuss any questions you have with your health care provider. Document Released: 01/21/2004 Document Revised: 10/17/2016 Document Reviewed: 10/17/2016 Elsevier Interactive Patient Education  2017 [employer].

## 2018-04-20 NOTE — Progress Notes (Signed)
Levi Cochran is a 77 y.o. male who presents to [facility] today for right shoulder pain.  Ravinder notes a several month history of right shoulder pain.  He recently moved to the area and in August 2018 was doing a lot of lifting and pushing and as part of his move.  He noted some pain then but it resolved over the winter when he decreased his activity.  Starting in about April with the spring gardening season he is been doing a lot more activity with his right shoulder.  He notes that it is worsened.  He has pain in his lateral upper arm worse with overhead motion and reaching back.  He denies any radiating pain weakness or numbness.  He does note the pain is also bad at bedtime.  He is tried some over-the-counter medications for pain which have helped a bit.  He is a pertinent past surgical history for left total shoulder replacement in 2017.  He notes this took him 5 to 6 months to recover from the surgery and he is very concerned that he may need a right total shoulder replacement.    ROS:  As above  Exam:  BP 130/62   Pulse 66   Ht 5\' 10"  (1.778 m)   Wt 185 lb (83.9 kg)   BMI 26.54 kg/m  General: Well Developed, well nourished, and in no acute distress.  Neuro/Psych: Alert and oriented x3, extra-ocular muscles intact, able to move all 4 extremities, sensation grossly intact. Skin: Warm and dry, no rashes noted.  Respiratory: Not using accessory muscles, speaking in full sentences, trachea midline.  Cardiovascular: Pulses palpable, no extremity edema. Abdomen: Does not appear distended. MSK:  Right shoulder relatively normal-appearing with no erythema or exudate. Range of motion intact pain with internal rotation. Positive Hawkins and Neer's test. Positive empty can test. Strength 4/5 to abduction and external rotation 5/5 to internal rotation. Pulses capillary fill and sensation are intact distally.  Procedure: Real-time Ultrasound  Guided Injection of right subacromial bursa  Device: GE Logiq E   Images permanently stored and available for review in the ultrasound unit. Verbal informed consent obtained.  Discussed risks and benefits of procedure. Warned about infection bleeding damage to structures skin hypopigmentation and fat atrophy among others. Patient expresses understanding and agreement Time-out conducted.   Noted no overlying erythema, induration, or other signs of local infection.   Skin prepped in a sterile fashion.   Local anesthesia: Topical Ethyl chloride.   With sterile technique and under real time ultrasound guidance:  40mg  kenalog and 71ml marcaine injected easily.   Completed without difficulty   Pain immediately resolved suggesting accurate placement of the medication.   Advised to call if fevers/chills, erythema, induration, drainage, or persistent bleeding.   Images permanently stored and available for review in the ultrasound unit.  Impression: Technically successful ultrasound guided injection.    Lab and Radiology Results My personal interpretation of x-ray images from right shoulder Degenerative changes at [facility] joint.  No significant DJD glenohumeral joint.  No fractures or other acute findings. Awaiting for radiology review.  Assessment and Plan: 77 y.o. male with  Right shoulder pain.  No significant glenohumeral DJD on x-ray.  Patient does have some AC DJD but based on his x-ray appearance I am doubtful that he will need a total shoulder replacement anytime soon. I believe the pain today is much more likely due to rotator cuff tendinitis or impingement.  Plan for  subacromial injection as noted above.  Additionally will treat with physical therapy and home exercise program.  Referral placed.  Recheck in 6 weeks or so.    Orders Placed This Encounter  Procedures  . DG Shoulder Right    Standing Status:   Future    Number of Occurrences:   1    Standing Expiration Date:   06/21/2019     Order Specific Question:   Reason for Exam (SYMPTOM  OR DIAGNOSIS REQUIRED)    Answer:   right shoudler pain hx lt TSR    Order Specific Question:   Preferred imaging location?    Answer:   [facility]    Order Specific Question:   Radiology Contrast Protocol - do NOT remove file path    Answer:   \\[email or web address]  . Ambulatory referral to Physical Therapy    Referral Priority:   Routine    Referral Type:   Physical Medicine    Referral Reason:   Specialty Services Required    Requested Specialty:   Physical Therapy   No orders of the defined types were placed in this encounter.   Historical information moved to improve visibility of documentation.  Past Medical History:  Diagnosis Date  . Diabetes mellitus without complication ([place])   . Hyperlipidemia   . Hypertension    Past Surgical History:  Procedure Laterality Date  . BACK SURGERY    . HAND SURGERY    . PROSTATE SURGERY    . TONSILLECTOMY     Social History   Tobacco Use  . Smoking status: Former Smoker    Last attempt to quit: 11/14/1968    Years since quitting: 49.4  . Smokeless tobacco: Never Used  Substance Use Topics  . Alcohol use: Never    Frequency: Never   family history includes Cancer in his mother; Diabetes in his mother; Heart attack in his sister.  Medications: Current Outpatient Medications  Medication Sig Dispense Refill  . amLODipine (NORVASC) 5 MG tablet Take 1 tablet (5 mg total) by mouth daily. 90 tablet 3  . aspirin EC 81 MG tablet Take 81 mg by mouth.    . DULoxetine (CYMBALTA) 20 MG capsule Take 1 capsule (20 mg total) by mouth daily. 90 capsule 3  . gabapentin (NEURONTIN) 300 MG capsule Take 1 capsule (300 mg total) by mouth at bedtime. 90 capsule 3  . latanoprost (XALATAN) 0.005 % ophthalmic solution Administer 1 drop to both eyes daily.     Marland Kitchen lisinopril-hydrochlorothiazide (PRINZIDE,ZESTORETIC) 20-12.5 MG tablet Take 2 tablets by mouth  daily. 180 tablet 3  . metFORMIN (GLUCOPHAGE) 500 MG tablet TAKE TWO TABLETS BY MOUTH TWICE DAILY WITH MEALS    . simvastatin (ZOCOR) 10 MG tablet Take 1 tablet (10 mg total) by mouth daily. 90 tablet 3  . timolol (TIMOPTIC) 0.5 % ophthalmic solution Administer 1 drop to both eyes Two (2) times a day.      No current facility-administered medications for this visit.    No Known Allergies    Discussed warning signs or symptoms. Please see discharge instructions. Patient expresses understanding.

## 2018-04-23 DIAGNOSIS — H43813 Vitreous degeneration, bilateral: Secondary | ICD-10-CM | POA: Diagnosis not present

## 2018-04-23 DIAGNOSIS — Z961 Presence of intraocular lens: Secondary | ICD-10-CM | POA: Diagnosis not present

## 2018-04-23 DIAGNOSIS — H401111 Primary open-angle glaucoma, right eye, mild stage: Secondary | ICD-10-CM | POA: Diagnosis not present

## 2018-04-23 DIAGNOSIS — H02831 Dermatochalasis of right upper eyelid: Secondary | ICD-10-CM | POA: Diagnosis not present

## 2018-04-23 DIAGNOSIS — H527 Unspecified disorder of refraction: Secondary | ICD-10-CM | POA: Diagnosis not present

## 2018-04-23 DIAGNOSIS — H25811 Combined forms of age-related cataract, right eye: Secondary | ICD-10-CM | POA: Diagnosis not present

## 2018-04-23 DIAGNOSIS — E119 Type 2 diabetes mellitus without complications: Secondary | ICD-10-CM | POA: Diagnosis not present

## 2018-04-23 DIAGNOSIS — H02834 Dermatochalasis of left upper eyelid: Secondary | ICD-10-CM | POA: Diagnosis not present

## 2018-04-23 DIAGNOSIS — H401122 Primary open-angle glaucoma, left eye, moderate stage: Secondary | ICD-10-CM | POA: Diagnosis not present

## 2018-04-27 ENCOUNTER — Ambulatory Visit (INDEPENDENT_AMBULATORY_CARE_PROVIDER_SITE_OTHER): Payer: Medicare Other | Admitting: Rehabilitative and Restorative Service Providers"

## 2018-04-27 ENCOUNTER — Encounter: Payer: Self-pay | Admitting: Rehabilitative and Restorative Service Providers"

## 2018-04-27 DIAGNOSIS — R29898 Other symptoms and signs involving the musculoskeletal system: Secondary | ICD-10-CM

## 2018-04-27 DIAGNOSIS — M25511 Pain in right shoulder: Secondary | ICD-10-CM | POA: Diagnosis present

## 2018-04-27 DIAGNOSIS — G8929 Other chronic pain: Secondary | ICD-10-CM | POA: Diagnosis not present

## 2018-04-27 DIAGNOSIS — R293 Abnormal posture: Secondary | ICD-10-CM

## 2018-04-27 NOTE — Patient Instructions (Signed)
Axial Extension (Chin Tuck)    Pull chin in and lengthen back of neck. Hold __5__ seconds while counting out loud. Repeat __10__ times. Do __several__ sessions per day.  Shoulder Blade Squeeze   Can use swim noodle to help with posture for this exercise Rotate shoulders back, then squeeze shoulder blades down and back. Hold 10 sec Repeat __10__ times. Do _several___ sessions per day.  Upper Back Strength: Lower Trapezius / Rotator Cuff " L's "     Arms in waitress pose, palms up. Press hands back and slide shoulder blades down. Hold for __5__ seconds. Repeat _10___ times. 1-2 times per day.    Scapular Retraction: Elbow Flexion (Standing)  "W's"     With elbows bent to 90, pinch shoulder blades together and rotate arms out, keeping elbows bent. Repeat __10__ times per set. Do __1-2__ sets per session. Do _several ___ sessions per day.   TENS UNIT: This is helpful for muscle pain and spasm.   Search and Purchase a TENS 7000 2nd edition at www.tenspros.com. It should be less than $30.     TENS unit instructions: Do not shower or bathe with the unit on Turn the unit off before removing electrodes or batteries If the electrodes lose stickiness add a drop of water to the electrodes after they are disconnected from the unit and place on plastic sheet. If you continued to have difficulty, call the TENS unit company to purchase more electrodes. Do not apply lotion on the skin area prior to use. Make sure the skin is clean and dry as this will help prolong the life of the electrodes. After use, always check skin for unusual red areas, rash or other skin difficulties. If there are any skin problems, does not apply electrodes to the same area. Never remove the electrodes from the unit by pulling the wires. Do not use the TENS unit or electrodes other than as directed. Do not change electrode placement without consultating your therapist or physician. Keep 2 fingers with between  each electrode.   [facility] at [facility] [place], [place] [address]  [phone] (office) [phone] (fax)

## 2018-04-27 NOTE — Therapy (Signed)
[place], [place], [address] Phone: [phone]   Fax:  [phone]  Physical Therapy Evaluation  Patient Details  Name: Levi Cochran MRN: [id number] Date of Birth: [birth year] Referring Provider: Dr Lynne Leader    Encounter Date: 04/27/2018  PT End of Session - 04/27/18 1257    Visit Number  1    Number of Visits  12    Date for PT Re-Evaluation  06/08/18    PT Start Time  0845    PT Stop Time  0948    PT Time Calculation (min)  63 min    Activity Tolerance  Patient tolerated treatment well       Past Medical History:  Diagnosis Date  . Diabetes mellitus without complication ([place])   . Hyperlipidemia   . Hypertension     Past Surgical History:  Procedure Laterality Date  . BACK SURGERY    . HAND SURGERY    . PROSTATE SURGERY    . TONSILLECTOMY      There were no vitals filed for this visit.   Subjective Assessment - 04/27/18 0900    Subjective  Patient reports that he had gradual onset of Rt shoulder pain 12/18 which he feels is related to moving and lifting boxes/furniture. Symptoms have gradually increased over the past 6 months.     Pertinent History  Lt TSA 10/17; 2 lumbar spine surgeries in 1975 and 1985; [place]; prostate surgery     Diagnostic tests  tendinitis Rt shoulder     Patient Stated Goals  get rid of pain and prevent surgery     Currently in Pain?  Yes    Pain Score  5     Pain Location  Shoulder    Pain Orientation  Right    Pain Descriptors / Indicators  Aching;Stabbing    Pain Type  Chronic pain    Pain Onset  More than a month ago    Aggravating Factors   lifting; reaching; pulling; lying on the Rt side    Pain Relieving Factors  changing positions; meds         [facility] PT Assessment - 04/27/18 0001      Assessment   Medical Diagnosis  Rt shoulder dysfunction    Referring Provider  Dr Lynne Leader     Onset Date/Surgical Date  10/14/17    Hand Dominance   Right    Next MD Visit  6 weeks    Prior Therapy  none for Rt shoulder       Precautions   Precautions  None      Balance Screen   Has the patient fallen in the past 6 months  No    Has the patient had a decrease in activity level because of a fear of falling?   No    Is the patient reluctant to leave their home because of a fear of falling?   No      Home Film/video editor residence      Prior Function   Level of Independence  Independent    Vocation  Retired    Information systems manager - retired 2005     Leisure  yard work; Runner, broadcasting/film/video tasks; settling into new home       Observation/Other Assessments   Focus on Therapeutic Outcomes (FOTO)   41% limitation       Sensation   Additional Comments  thumb  and index fingers numb since Lt TSA ; some numbness in the Rt hand when he awakens at tomes       Posture/Postural Control   Posture Comments  head forward; shoulders roundedna dnelevated; head of the humerus anterior in orientation; scapulae abducted and rotated along the thoracic wall       AROM   Right/Left Shoulder  -- pain rt shoulder abduction int rot and ext rot    Right Shoulder Extension  44 Degrees    Right Shoulder Flexion  119 Degrees    Right Shoulder ABduction  90 Degrees    Right Shoulder Internal Rotation  22 Degrees    Right Shoulder External Rotation  63 Degrees    Left Shoulder Extension  106 Degrees    Left Shoulder Flexion  120 Degrees    Left Shoulder ABduction  94 Degrees    Left Shoulder Internal Rotation  35 Degrees    Left Shoulder External Rotation  75 Degrees    Cervical Flexion  70    Cervical Extension  47    Cervical - Right Side Bend  28    Cervical - Left Side Bend  27    Cervical - Right Rotation  62    Cervical - Left Rotation  66      Strength   Overall Strength Comments  ~ 5/5 bilat UE's - pain with resistive testing Rt shoulder flexion, abduction, IR, ER       Palpation   Spinal mobility  decreased  mobility through the thoracic and lower cervical spine with CPA mobs    Palpation comment  muscular tightness Rt > Lt pecs; upper trap; leveator; teres; cervical musculature                 Objective measurements completed on examination: See above findings.      [facility] Adult PT Treatment/Exercise - 04/27/18 0001      Neuro Re-ed    Neuro Re-ed Details   -- initiated postural correction       Shoulder Exercises: Standing   Other Standing Exercises  axial extension 10 sec x 10; scap squeeze 10 sec x 10; L's; W's x 10 with swim noodle       Moist Heat Therapy   Number Minutes Moist Heat  20 Minutes    Moist Heat Location  Shoulder Rt      Electrical Stimulation   Electrical Stimulation Location  Rt shoulder girdle    Electrical Stimulation Action  IFC    Electrical Stimulation Parameters  to tolerance    Electrical Stimulation Goals  Tone;Pain             PT Education - 04/27/18 1249    Education Details  HEP postural correction; TENS     Person(s) Educated  Patient    Methods  Explanation;Demonstration;Tactile cues;Verbal cues;Handout          PT Long Term Goals - 04/27/18 1302      PT LONG TERM GOAL #1   Title  Improve posture and alignment with patient to demonstrate imporved upright posture with posterior shoulder girdle engaged 06/08/18    Time  6    Period  Weeks    Status  New      PT LONG TERM GOAL #2   Title  Increase AROM Rt shoulder to =/> than AROM Lt shoulder with no pain 06/08/18    Time  6    Period  Weeks    Status  New      PT LONG TERM GOAL #3   Title  Patient to report pain free functional activity level 06/08/18    Time  6    Period  Weeks    Status  New      PT LONG TERM GOAL #4   Title  Independent in HEP 06/08/18    Time  6    Period  Weeks    Status  New      PT LONG TERM GOAL #5   Title  Improve FOTO to </= 33% limitation 06/08/18    Time  6    Period  Weeks    Status  New             Plan - 04/27/18 1258     Clinical Impression Statement  Patient presents with 6 month history of Rt shoulder pain of gradual onset with symtpoms increasing in the past month. He has poor posture and alignemnt; limited cervical and shoudler ORM/mobility; pain with resistive strength testing Rt UE; muscular tightness to palpation through Rt upper quarter; pain with functional activities. Patient will benefit from PT to address problems identified.     History and Personal Factors relevant to plan of care:  Has Lt TA from 10/17     Clinical Presentation  Stable    Clinical Decision Making  Low    Rehab Potential  Good    PT Frequency  2x / week    PT Duration  6 weeks    PT Treatment/Interventions  Patient/family education;ADLs/Self Care Home Management;Cryotherapy;Electrical Stimulation;Iontophoresis 4mg /ml Dexamethasone;Moist Heat;Ultrasound;Dry needling;Manual techniques;Neuromuscular re-education;Therapeutic activities;Therapeutic exercise    PT Next Visit Plan  review HEP; add pec stretch as tolerated(one or both arms - Lt TSA); continue postural work; thoracic extension/mobs; manual work Rt shoudler girdle focus on pecs; modalities as indicated     Oncologist with Plan of Care  Patient       Patient will benefit from skilled therapeutic intervention in order to improve the following deficits and impairments:  Postural dysfunction, Improper body mechanics, Increased fascial restricitons, Increased muscle spasms, Hypomobility, Decreased mobility, Decreased range of motion, Decreased activity tolerance, Pain  Visit Diagnosis: Chronic right shoulder pain - Plan: PT plan of care cert/re-cert  Abnormal posture - Plan: PT plan of care cert/re-cert  Other symptoms and signs involving the musculoskeletal system - Plan: PT plan of care cert/re-cert     Problem List Patient Active Problem List   Diagnosis Date Noted  . Chronic right shoulder pain 11/01/2017  . Trigger finger, acquired 04/26/2017  . Brachial  plexopathy 04/18/2017  . Carpal tunnel syndrome of left wrist 04/18/2017  . Ulnar neuropathy at elbow of left upper extremity 04/18/2017  . S/P shoulder replacement, left 10/11/2016  . Situational depression 09/12/2016  . Left shoulder pain 08/23/2016  . OSA (obstructive sleep apnea) 07/14/2016  . Dysgeusia 07/13/2016  . Type 2 diabetes mellitus without complication ([place]) 25/36/6440  . Benign enlargement of prostate 10/28/2014  . Contracture of palmar fascia 03/18/2013  . Acquired cyst of kidney 01/31/2013  . Kidney stone 09/24/2012  . Benign essential hypertension 04/17/2012  . Mixed hyperlipidemia 04/17/2012  . Other specified glaucoma 04/17/2012    Janasia Coverdale Nilda Simmer PT, MPH  04/27/2018, 1:06 PM  [facility] [place], [place], [address] Phone: [phone]   Fax:  [phone]  Name: Levi Cochran MRN: [id number] Date of Birth: Jan 04, 1941

## 2018-05-01 ENCOUNTER — Encounter: Payer: Self-pay | Admitting: Rehabilitative and Restorative Service Providers"

## 2018-05-01 ENCOUNTER — Ambulatory Visit (INDEPENDENT_AMBULATORY_CARE_PROVIDER_SITE_OTHER): Payer: Medicare Other | Admitting: Rehabilitative and Restorative Service Providers"

## 2018-05-01 DIAGNOSIS — R293 Abnormal posture: Secondary | ICD-10-CM

## 2018-05-01 DIAGNOSIS — R29898 Other symptoms and signs involving the musculoskeletal system: Secondary | ICD-10-CM

## 2018-05-01 DIAGNOSIS — G8929 Other chronic pain: Secondary | ICD-10-CM

## 2018-05-01 DIAGNOSIS — M25511 Pain in right shoulder: Secondary | ICD-10-CM | POA: Diagnosis present

## 2018-05-01 NOTE — Patient Instructions (Signed)
Scapula Adduction With Pectoralis Stretch: Low - Standing   Shoulders at 45 hands even with shoulders, keeping weight through legs, shift weight forward until you feel pull or stretch through the front of your chest. Hold _30__ seconds. Do _3__ times, _2-4__ times per day.   Scapula Adduction With Pectoralis Stretch: Mid-Range - Standing   Shoulders at 90 elbows even with shoulders, keeping weight through legs, shift weight forward until you feel pull or strength through the front of your chest. Hold __30_ seconds. Do _3__ times, __2-4_ times per day.   Scapula Adduction With Pectoralis Stretch: High - Standing   Shoulders at 120 hands up high on the doorway, keeping weight on feet, shift weight forward until you feel pull or stretch through the front of your chest. Hold _30__ seconds. Do _3__ times, _2-3__ times per day.  

## 2018-05-01 NOTE — Therapy (Signed)
[place], [place], [address] Phone: [phone]   Fax:  [phone]  Physical Therapy Treatment  Patient Details  Name: Levi Cochran MRN: [id number] Date of Birth: 07-12-1941 Referring Provider: Dr Lynne Leader    Encounter Date: 05/01/2018  PT End of Session - 05/01/18 1104    Visit Number  2    Number of Visits  12    Date for PT Re-Evaluation  06/08/18    PT Start Time  1101    PT Stop Time  1159    PT Time Calculation (min)  58 min    Activity Tolerance  Patient tolerated treatment well       Past Medical History:  Diagnosis Date  . Diabetes mellitus without complication ([place])   . Hyperlipidemia   . Hypertension     Past Surgical History:  Procedure Laterality Date  . BACK SURGERY    . HAND SURGERY    . PROSTATE SURGERY    . TONSILLECTOMY      There were no vitals filed for this visit.  Subjective Assessment - 05/01/18 1104    Subjective  Has been doing his exercises at home. Not having much pain but nothing like a constant pain.     Currently in Pain?  No/denies                       [facility] Adult PT Treatment/Exercise - 05/01/18 0001      Shoulder Exercises: Standing   Extension  Strengthening;Right;Left;10 reps;Theraband    Theraband Level (Shoulder Extension)  Level 2 (Red)    Row  Strengthening;Right;Left;10 reps;Theraband    Theraband Level (Shoulder Row)  Level 2 (Red)    Retraction  Strengthening;Right;Left;10 reps;Theraband    Theraband Level (Shoulder Retraction)  Level 2 (Red)    Other Standing Exercises  axial extension 10 sec x 10; scap squeeze 10 sec x 10; L's; W's x 10 with swim noodle       Shoulder Exercises: Pulleys   Flexion  -- 10 sec hold x 5 each UE       Shoulder Exercises: Stretch   Other Shoulder Stretches  doorway stretch 30 sec 2 reps each position       Moist Heat Therapy   Number Minutes Moist Heat  20 Minutes    Moist Heat  Location  Shoulder Rt      Electrical Stimulation   Electrical Stimulation Location  Rt shoulder girdle    Electrical Stimulation Action  IFC    Electrical Stimulation Parameters  to tolerance    Electrical Stimulation Goals  Tone;Pain      Manual Therapy   Manual therapy comments  pt supine    Joint Mobilization  GH mobs/circumduction    Soft tissue mobilization  deep tissue work through the [employer] girdle focus on pecs and teres    Scapular Mobilization  Rt distraction and inferior glide    Passive ROM  PROM Rt shoulder flexion; scaptioin; ER; IR              PT Education - 05/01/18 1134    Education Details  HEP     Person(s) Educated  Patient    Methods  Explanation;Demonstration;Tactile cues;Verbal cues;Handout    Comprehension  Verbalized understanding;Returned demonstration;Verbal cues required;Tactile cues required          PT Long Term Goals - 05/01/18 1104      PT LONG TERM  GOAL #1   Title  Improve posture and alignment with patient to demonstrate imporved upright posture with posterior shoulder girdle engaged 06/08/18    Time  6    Period  Weeks    Status  On-going      PT LONG TERM GOAL #2   Title  Increase AROM Rt shoulder to =/> than AROM Lt shoulder with no pain 06/08/18    Time  6    Period  Weeks    Status  On-going      PT LONG TERM GOAL #3   Title  Patient to report pain free functional activity level 06/08/18    Time  6    Period  Weeks    Status  On-going      PT LONG TERM GOAL #4   Title  Independent in HEP 06/08/18    Time  6    Period  Weeks    Status  On-going      PT LONG TERM GOAL #5   Title  Improve FOTO to </= 33% limitation 06/08/18    Time  6    Period  Weeks    Status  On-going            Plan - 05/01/18 1108    Clinical Impression Statement  Good improvement with injection and initial HEP. Reviewed exercises and provided correction as needed. Added exercise without difficulty. Progressing well toward stated  goalso of therapy.     Rehab Potential  Good    PT Frequency  2x / week    PT Duration  6 weeks    PT Treatment/Interventions  Patient/family education;ADLs/Self Care Home Management;Cryotherapy;Electrical Stimulation;Iontophoresis 4mg /ml Dexamethasone;Moist Heat;Ultrasound;Dry needling;Manual techniques;Neuromuscular re-education;Therapeutic activities;Therapeutic exercise    PT Next Visit Plan  review HEP; add pec stretch as tolerated(one or both arms - Lt TSA); continue postural work; thoracic extension/mobs; manual work Rt shoudler girdle focus on pecs; modalities as indicated     Oncologist with Plan of Care  Patient       Patient will benefit from skilled therapeutic intervention in order to improve the following deficits and impairments:  Postural dysfunction, Improper body mechanics, Increased fascial restricitons, Increased muscle spasms, Hypomobility, Decreased mobility, Decreased range of motion, Decreased activity tolerance, Pain  Visit Diagnosis: Chronic right shoulder pain  Abnormal posture  Other symptoms and signs involving the musculoskeletal system     Problem List Patient Active Problem List   Diagnosis Date Noted  . Chronic right shoulder pain 11/01/2017  . Trigger finger, acquired 04/26/2017  . Brachial plexopathy 04/18/2017  . Carpal tunnel syndrome of left wrist 04/18/2017  . Ulnar neuropathy at elbow of left upper extremity 04/18/2017  . S/P shoulder replacement, left 10/11/2016  . Situational depression 09/12/2016  . Left shoulder pain 08/23/2016  . OSA (obstructive sleep apnea) 07/14/2016  . Dysgeusia 07/13/2016  . Type 2 diabetes mellitus without complication ([place]) 09/07/8526  . Benign enlargement of prostate 10/28/2014  . Contracture of palmar fascia 03/18/2013  . Acquired cyst of kidney 01/31/2013  . Kidney stone 09/24/2012  . Benign essential hypertension 04/17/2012  . Mixed hyperlipidemia 04/17/2012  . Other specified glaucoma  04/17/2012    Samvel Zinn Nilda Simmer PT, MPH  05/01/2018, 11:49 AM  [facility] [place], [place], [address] Phone: [phone]   Fax:  [phone]  Name: Levi Cochran MRN: [id number] Date of Birth: 04/02/1941

## 2018-05-02 ENCOUNTER — Ambulatory Visit (INDEPENDENT_AMBULATORY_CARE_PROVIDER_SITE_OTHER): Payer: Medicare Other | Admitting: Osteopathic Medicine

## 2018-05-02 ENCOUNTER — Encounter: Payer: Self-pay | Admitting: Osteopathic Medicine

## 2018-05-02 VITALS — BP 117/69 | HR 67 | Temp 98.4°F | Wt 183.9 lb

## 2018-05-02 DIAGNOSIS — E119 Type 2 diabetes mellitus without complications: Secondary | ICD-10-CM

## 2018-05-02 LAB — POCT GLYCOSYLATED HEMOGLOBIN (HGB A1C): HEMOGLOBIN A1C: 7.6 % — AB (ref 4.0–5.6)

## 2018-05-02 MED ORDER — METFORMIN HCL 500 MG PO TABS: 1000.0000 mg | ORAL_TABLET | Freq: Two times a day (BID) | ORAL | 3 refills | Status: AC

## 2018-05-02 NOTE — Patient Instructions (Signed)
   Will plan to recheck A1C in another 3 months  Watch diet in the meantime, and exercise as tolerated  If A1C is getting up toward 8.0, we might think about adding a medication   Otherwise, continue doing what you're doing!

## 2018-05-02 NOTE — Progress Notes (Signed)
HPI: Levi Cochran is a 77 y.o. male who  has a past medical history of Diabetes mellitus without complication ([place]), Hyperlipidemia, and Hypertension.  he presents to [facility] today, 05/02/18,  for chief complaint of:  DM2 follow-up   Follow-up type 2 diabetes: A1c is not bad for age but has been creeping up over the past year or so.  Patient does not report any problems with diabetic diet compliance, less exercise than usual lately though.  No hypoglycemic episodes.  Recent treatment for complicated UTI: Patient states no additional issues and was cleared by urology.    Past medical history, surgical history, and family history reviewed.  Current medication list and allergy/intolerance information reviewed.   (See remainder of HPI, ROS, Phys Exam below)  No results found.  Results for orders placed or performed in visit on 05/02/18 (from the past 72 hour(s))  POCT HgB A1C     Status: Abnormal   Collection Time: 05/02/18 11:20 AM  Result Value Ref Range   Hemoglobin A1C 7.6 (A) 4.0 - 5.6 %   HbA1c, POC (prediabetic range)  5.7 - 6.4 %   HbA1c, POC (controlled diabetic range)  0.0 - 7.0 %     ASSESSMENT/PLAN:   Type 2 diabetes mellitus without complication, without long-term current use of insulin ([place]) - Plan: POCT HgB A1C    Patient Instructions   Will plan to recheck A1C in another 3 months  Watch diet in the meantime, and exercise as tolerated  If A1C is getting up toward 8.0, we might think about adding a medication   Otherwise, continue doing what you're doing!    Follow-up plan: Return in about 3 months (around 08/02/2018) for recheck diabetes, see me sooner if needed, follow as directed w/ Dr Georgina Snell .     ############################################ ############################################ ############################################ ############################################    Outpatient Encounter  Medications as of 05/02/2018  Medication Sig  . amLODipine (NORVASC) 5 MG tablet Take 1 tablet (5 mg total) by mouth daily.  Marland Kitchen aspirin EC 81 MG tablet Take 81 mg by mouth.  . DULoxetine (CYMBALTA) 20 MG capsule Take 1 capsule (20 mg total) by mouth daily.  Marland Kitchen gabapentin (NEURONTIN) 300 MG capsule Take 1 capsule (300 mg total) by mouth at bedtime.  Marland Kitchen latanoprost (XALATAN) 0.005 % ophthalmic solution Administer 1 drop to both eyes daily.   Marland Kitchen lisinopril-hydrochlorothiazide (PRINZIDE,ZESTORETIC) 20-12.5 MG tablet Take 2 tablets by mouth daily.  . metFORMIN (GLUCOPHAGE) 500 MG tablet TAKE TWO TABLETS BY MOUTH TWICE DAILY WITH MEALS  . simvastatin (ZOCOR) 10 MG tablet Take 1 tablet (10 mg total) by mouth daily.  . timolol (TIMOPTIC) 0.5 % ophthalmic solution Administer 1 drop to both eyes Two (2) times a day.    No facility-administered encounter medications on file as of 05/02/2018.    No Known Allergies    Review of Systems:  Constitutional: No recent illness  Cardiac: No  chest pain, No  pressure, No palpitations  Respiratory:  No  shortness of breath. No  Cough  Neurologic: No  weakness, No  Dizziness   Exam:  BP 117/69 (BP Location: Left Arm, Patient Position: Sitting, Cuff Size: Normal)   Pulse 67   Temp 98.4 F (36.9 C) (Oral)   Wt 183 lb 14.4 oz (83.4 kg)   BMI 26.39 kg/m   Constitutional: VS see above. General Appearance: alert, well-developed, well-nourished, NAD  Eyes: Normal lids and conjunctive, non-icteric sclera  Ears, Nose, Mouth, Throat: MMM,  Normal external inspection ears/nares/mouth/lips/gums.  Neck: No masses, trachea midline.   Respiratory: Normal respiratory effort. no wheeze, no rhonchi, no rales  Cardiovascular: S1/S2 normal, no murmur, no rub/gallop auscultated. RRR.   Musculoskeletal: Gait normal. Symmetric and independent movement of all extremities  Neurological: Normal balance/coordination. No tremor.  Skin: warm, dry, intact.   Psychiatric:  Normal judgment/insight. Normal mood and affect. Oriented x3.   Visit summary with medication list and pertinent instructions was printed for patient to review, advised to alert [place] if any changes needed. All questions at time of visit were answered - patient instructed to contact office with any additional concerns. ER/RTC precautions were reviewed with the patient and understanding verbalized.   Follow-up plan: Return in about 3 months (around 08/02/2018) for recheck diabetes, see me sooner if needed, follow as directed w/ Dr Georgina Snell .  Note: Total time spent 15 minutes, greater than 50% of the visit was spent face-to-face counseling and coordinating care for the following: The encounter diagnosis was Type 2 diabetes mellitus without complication, without long-term current use of insulin ([place])..  Please note: voice recognition software was used to produce this document, and typos may escape review. Please contact Dr. Sheppard Coil for any needed clarifications.

## 2018-05-03 ENCOUNTER — Encounter: Payer: Medicare Other | Admitting: Rehabilitative and Restorative Service Providers"

## 2018-05-04 ENCOUNTER — Ambulatory Visit (INDEPENDENT_AMBULATORY_CARE_PROVIDER_SITE_OTHER): Payer: Medicare Other | Admitting: Physical Therapy

## 2018-05-04 ENCOUNTER — Encounter: Payer: Medicare Other | Admitting: Physical Therapy

## 2018-05-04 DIAGNOSIS — M25511 Pain in right shoulder: Secondary | ICD-10-CM | POA: Diagnosis present

## 2018-05-04 DIAGNOSIS — R29898 Other symptoms and signs involving the musculoskeletal system: Secondary | ICD-10-CM

## 2018-05-04 DIAGNOSIS — R293 Abnormal posture: Secondary | ICD-10-CM

## 2018-05-04 DIAGNOSIS — G8929 Other chronic pain: Secondary | ICD-10-CM | POA: Diagnosis not present

## 2018-05-04 NOTE — Therapy (Signed)
[place], [place], [address] Phone: [phone]   Fax:  [phone]  Physical Therapy Treatment  Patient Details  Name: Levi Cochran MRN: [id number] Date of Birth: 15-Oct-1941 Referring Provider: Dr. Georgina Snell   Encounter Date: 05/04/2018  PT End of Session - 05/04/18 1407    Visit Number  3    Number of Visits  12    Date for PT Re-Evaluation  06/08/18    PT Start Time  9563    PT Stop Time  1448    PT Time Calculation (min)  45 min       Past Medical History:  Diagnosis Date  . Diabetes mellitus without complication ([place])   . Hyperlipidemia   . Hypertension     Past Surgical History:  Procedure Laterality Date  . BACK SURGERY    . HAND SURGERY    . PROSTATE SURGERY    . TONSILLECTOMY      There were no vitals filed for this visit.  Subjective Assessment - 05/04/18 1407    Subjective  Pt reports he has been doing his exercises daily, except yesterday due to being busy with painters coming to house.  "I can tell it is getting better".   He has been sitting in recliner with extra cushion behind back.  He received a TENS and has used it a couple times.  He's been able to sleep on Right side some.     Patient Stated Goals  get rid of pain and prevent surgery     Currently in Pain?  Yes    Pain Score  2     Pain Location  Shoulder    Pain Orientation  Right    Pain Descriptors / Indicators  Aching    Aggravating Factors   reaching up high and lifting something heavy.     Pain Relieving Factors  rest    Multiple Pain Sites  No         [facility] PT Assessment - 05/04/18 0001      Assessment   Medical Diagnosis  Rt shoulder dysfunction    Referring Provider  Dr. Georgina Snell    Onset Date/Surgical Date  10/14/17    Hand Dominance  Right    Next MD Visit  06/01/18      AROM   Right Shoulder Extension  50 Degrees    Right Shoulder Flexion  136 Degrees    Right Shoulder ABduction  130 Degrees     Right Shoulder Internal Rotation  -- thumb to T10    Right Shoulder External Rotation  72 Degrees supine, abdt to 70 deg    Left Shoulder External Rotation  88 Degrees       [facility] Adult PT Treatment/Exercise - 05/04/18 0001      Self-Care   Self-Care  Other Self-Care Comments      Shoulder Exercises: Supine   Other Supine Exercises  elbow press x 10 sec x 5 reps       Shoulder Exercises: Seated   Other Seated Exercises  thoracic ext over back of chair with hands behind head x 3 breaths x 4 reps       Shoulder Exercises: Standing   Internal Rotation  AROM;Right;10 reps sliding arm up behind back, and back down.     Extension  Strengthening;Right;Left;Theraband;15 reps 2-3 sec pause in retraction    Theraband Level (Shoulder Extension)  Level 2 (Red)    Row  Strengthening;Right;Left;10  reps;Theraband 2-3 sec pause in retraction    Theraband Level (Shoulder Row)  Level 2 (Red)    Retraction  Strengthening back against pool noodle.     Theraband Level (Shoulder Retraction)  Level 2 (Red)    Other Standing Exercises  W's with back against pool noodle x 10 reps       Shoulder Exercises: ROM/Strengthening   Nustep  L4: (arms/legs) 5 min       Shoulder Exercises: Stretch   Other Shoulder Stretches  3 position doorway stretch 30 sec x 2 reps each position       Modalities   Modalities  -- pt declined      Moist Heat Therapy   Number Minutes Moist Heat  --    Moist Heat Location  --      Acupuncturist Stimulation Location  --    Printmaker Action  --    Electrical Stimulation Parameters  --    Printmaker Goals  --             PT Education - 05/04/18 1447    Education Details  HEP    Person(s) Educated  Patient    Methods  Explanation;Handout;Verbal cues;Tactile cues;Demonstration    Comprehension  Verbalized understanding;Returned demonstration          PT Long Term Goals - 05/01/18 1104      PT LONG TERM GOAL #1    Title  Improve posture and alignment with patient to demonstrate imporved upright posture with posterior shoulder girdle engaged 06/08/18    Time  6    Period  Weeks    Status  On-going      PT LONG TERM GOAL #2   Title  Increase AROM Rt shoulder to =/> than AROM Lt shoulder with no pain 06/08/18    Time  6    Period  Weeks    Status  On-going      PT LONG TERM GOAL #3   Title  Patient to report pain free functional activity level 06/08/18    Time  6    Period  Weeks    Status  On-going      PT LONG TERM GOAL #4   Title  Independent in HEP 06/08/18    Time  6    Period  Weeks    Status  On-going      PT LONG TERM GOAL #5   Title  Improve FOTO to </= 33% limitation 06/08/18    Time  6    Period  Weeks    Status  On-going            Plan - 05/04/18 1608    Clinical Impression Statement  Pt demonstrated improved Rt shoulder ROM, with less pain.  Pt tolerated all exercises well, requiring only minor cues for form and counting.  Pt was painfree at end of session; declined modalities - will use at home if needed later today.  Progressing well towards established goals.     Rehab Potential  Good    PT Frequency  2x / week    PT Duration  6 weeks    PT Treatment/Interventions  Patient/family education;ADLs/Self Care Home Management;Cryotherapy;Electrical Stimulation;Iontophoresis 4mg /ml Dexamethasone;Moist Heat;Ultrasound;Dry needling;Manual techniques;Neuromuscular re-education;Therapeutic activities;Therapeutic exercise    PT Next Visit Plan  continue postural work, including thoracic ext and post shoulder girdle strengthening.     Consulted and Agree with Plan of Care  Patient  Patient will benefit from skilled therapeutic intervention in order to improve the following deficits and impairments:  Postural dysfunction, Improper body mechanics, Increased fascial restricitons, Increased muscle spasms, Hypomobility, Decreased mobility, Decreased range of motion, Decreased  activity tolerance, Pain  Visit Diagnosis: Chronic right shoulder pain  Abnormal posture  Other symptoms and signs involving the musculoskeletal system     Problem List Patient Active Problem List   Diagnosis Date Noted  . Chronic right shoulder pain 11/01/2017  . Trigger finger, acquired 04/26/2017  . Brachial plexopathy 04/18/2017  . Carpal tunnel syndrome of left wrist 04/18/2017  . Ulnar neuropathy at elbow of left upper extremity 04/18/2017  . S/P shoulder replacement, left 10/11/2016  . Situational depression 09/12/2016  . Left shoulder pain 08/23/2016  . OSA (obstructive sleep apnea) 07/14/2016  . Dysgeusia 07/13/2016  . Type 2 diabetes mellitus without complication ([place]) 75/44/9201  . Benign enlargement of prostate 10/28/2014  . Contracture of palmar fascia 03/18/2013  . Acquired cyst of kidney 01/31/2013  . Kidney stone 09/24/2012  . Benign essential hypertension 04/17/2012  . Mixed hyperlipidemia 04/17/2012  . Other specified glaucoma 04/17/2012   Kerin Perna, PTA 05/04/18 4:14 PM  [facility] [place], [place], [address] Phone: [phone]   Fax:  [phone]  Name: Levi Cochran MRN: [id number] Date of Birth: 03-11-1941

## 2018-05-04 NOTE — Patient Instructions (Signed)
Elbow Press    Interlace fingers; bring hands underneath head. Press elbows down. Hold _15__ seconds. Relax arms. Repeat _3__ times.  Thoracic: Stretch - Lean Back (Chair)    Get ON TARGET. Sit on a low firm-backed chair, hands behind head. Elbows leading motion, lean back, arching upper body. Avoid undue pressure or quick stretching. Hold _10__ seconds. Repeat __3_ times. Do __1_ sessions per day  [facility] [place], [place] [address]  [phone] (office) [phone] (fax)

## 2018-05-09 ENCOUNTER — Encounter: Payer: Self-pay | Admitting: Rehabilitative and Restorative Service Providers"

## 2018-05-09 ENCOUNTER — Ambulatory Visit (INDEPENDENT_AMBULATORY_CARE_PROVIDER_SITE_OTHER): Payer: Medicare Other | Admitting: Rehabilitative and Restorative Service Providers"

## 2018-05-09 DIAGNOSIS — M25511 Pain in right shoulder: Secondary | ICD-10-CM

## 2018-05-09 DIAGNOSIS — R293 Abnormal posture: Secondary | ICD-10-CM | POA: Diagnosis not present

## 2018-05-09 DIAGNOSIS — G8929 Other chronic pain: Secondary | ICD-10-CM | POA: Diagnosis not present

## 2018-05-09 DIAGNOSIS — R29898 Other symptoms and signs involving the musculoskeletal system: Secondary | ICD-10-CM | POA: Diagnosis not present

## 2018-05-09 NOTE — Therapy (Signed)
[place], [place], [address] Phone: [phone]   Fax:  [phone]  Physical Therapy Treatment  Patient Details  Name: Levi Cochran MRN: [id number] Date of Birth: 1941-07-28 Referring Provider: Dr Georgina Snell    Encounter Date: 05/09/2018    Past Medical History:  Diagnosis Date  . Diabetes mellitus without complication ([place])   . Hyperlipidemia   . Hypertension     Past Surgical History:  Procedure Laterality Date  . BACK SURGERY    . HAND SURGERY    . PROSTATE SURGERY    . TONSILLECTOMY      There were no vitals filed for this visit.  Subjective Assessment - 05/09/18 0850    Subjective  Patient reports that he has "just a little bit" of pain in the Rt shoulder - but his back is hurting. He has episodic pain in the LB from surgery in the 70's and 80's.     Currently in Pain?  Yes    Pain Score  2     Pain Location  Shoulder    Pain Orientation  Right    Pain Descriptors / Indicators  Aching    Pain Type  Chronic pain    Pain Onset  More than a month ago         [facility] PT Assessment - 05/09/18 0001      Assessment   Medical Diagnosis  Rt shoulder dysfunction    Referring Provider  Dr Georgina Snell     Onset Date/Surgical Date  10/14/17    Hand Dominance  Right    Next MD Visit  06/01/18      AROM   Right Shoulder Extension  62 Degrees    Right Shoulder Flexion  141 Degrees    Right Shoulder ABduction  136 Degrees    Right Shoulder Internal Rotation  25 Degrees    Right Shoulder External Rotation  75 Degrees                   [facility] Adult PT Treatment/Exercise - 05/09/18 0001      Shoulder Exercises: Standing   Extension  Strengthening;Right;Left;Theraband;15 reps 2-3 sec pause in retraction    Theraband Level (Shoulder Extension)  Level 2 (Red)    Row  Strengthening;Right;Left;10 reps;Theraband 2-3 sec pause in retraction    Theraband Level (Shoulder Row)  Level 2 (Red)     Retraction  Strengthening back against pool noodle.     Theraband Level (Shoulder Retraction)  Level 2 (Red)    Other Standing Exercises  scap squeeze 10 sec x 10; L's x 10' W's x 10 with swim noodle       Shoulder Exercises: ROM/Strengthening   UBE (Upper Arm Bike)  L2 x 4 min alt fwd/back      Shoulder Exercises: Stretch   Other Shoulder Stretches  3 position doorway stretch 30 sec x 2 reps each position       Moist Heat Therapy   Number Minutes Moist Heat  20 Minutes    Moist Heat Location  Shoulder Rt and LB      Electrical Stimulation   Electrical Stimulation Location  Rt shoulder girdle    Electrical Stimulation Action  IFC    Electrical Stimulation Parameters  to tolerance    Electrical Stimulation Goals  Tone;Pain      Iontophoresis   Type of Iontophoresis  Dexamethasone    Location  anterior Rt shoulder    Dose  80 mAmp    Time  8 hours      Manual Therapy   Manual therapy comments  pt supine    Joint Mobilization  GH mobs/circumduction    Soft tissue mobilization  deep tissue work through the [place] shoudler girdle focus on pecs and teres    Scapular Mobilization  Rt distraction and inferior glide    Passive ROM  PROM Rt shoulder flexion; scaptioin; ER; IR              PT Education - 05/09/18 0926    Education Details  ionto    Person(s) Educated  Patient    Methods  Explanation    Comprehension  Verbalized understanding          PT Long Term Goals - 05/09/18 0927      PT LONG TERM GOAL #1   Title  Improve posture and alignment with patient to demonstrate imporved upright posture with posterior shoulder girdle engaged 06/08/18    Time  6    Period  Weeks    Status  On-going      PT LONG TERM GOAL #2   Title  Increase AROM Rt shoulder to =/> than AROM Lt shoulder with no pain 06/08/18    Time  6    Period  Weeks    Status  On-going      PT LONG TERM GOAL #3   Title  Patient to report pain free functional activity level 06/08/18    Time  6     Period  Weeks    Status  On-going      PT LONG TERM GOAL #4   Title  Independent in HEP 06/08/18    Time  6    Period  Weeks    Status  On-going      PT LONG TERM GOAL #5   Title  Improve FOTO to </= 33% limitation 06/08/18    Time  6    Period  Weeks    Status  On-going            Plan - 05/09/18 0926    Clinical Impression Statement  Gradual progress continues. Patient demonstrates increasing Rt shoulder ROM. He continues to have tightness to palpatioin through Rt pecs and teres. Responded well to manual work. ?Added trial of ionto to anterior Rt shoulder     Rehab Potential  Good    PT Frequency  2x / week    PT Treatment/Interventions  Patient/family education;ADLs/Self Care Home Management;Cryotherapy;Electrical Stimulation;Iontophoresis 4mg /ml Dexamethasone;Moist Heat;Ultrasound;Dry needling;Manual techniques;Neuromuscular re-education;Therapeutic activities;Therapeutic exercise    PT Next Visit Plan  continue postural work, including thoracic ext and post shoulder girdle strengthening. Assess response to ionto     Consulted and Agree with Plan of Care  Patient       Patient will benefit from skilled therapeutic intervention in order to improve the following deficits and impairments:  Postural dysfunction, Improper body mechanics, Increased fascial restricitons, Increased muscle spasms, Hypomobility, Decreased mobility, Decreased range of motion, Decreased activity tolerance, Pain  Visit Diagnosis: Chronic right shoulder pain  Abnormal posture  Other symptoms and signs involving the musculoskeletal system     Problem List Patient Active Problem List   Diagnosis Date Noted  . Chronic right shoulder pain 11/01/2017  . Trigger finger, acquired 04/26/2017  . Brachial plexopathy 04/18/2017  . Carpal tunnel syndrome of left wrist 04/18/2017  . Ulnar neuropathy at elbow of left upper extremity 04/18/2017  . S/P shoulder replacement,  left 10/11/2016  . Situational  depression 09/12/2016  . Left shoulder pain 08/23/2016  . OSA (obstructive sleep apnea) 07/14/2016  . Dysgeusia 07/13/2016  . Type 2 diabetes mellitus without complication ([place]) 96/22/2979  . Benign enlargement of prostate 10/28/2014  . Contracture of palmar fascia 03/18/2013  . Acquired cyst of kidney 01/31/2013  . Kidney stone 09/24/2012  . Benign essential hypertension 04/17/2012  . Mixed hyperlipidemia 04/17/2012  . Other specified glaucoma 04/17/2012    Pattye Meda Nilda Simmer PT, MPH  05/09/2018, 9:29 AM  [facility] [place], [place], [address] Phone: [phone]   Fax:  [phone]  Name: Levi Cochran MRN: [id number] Date of Birth: 1940-12-31

## 2018-05-09 NOTE — Patient Instructions (Signed)

## 2018-05-14 ENCOUNTER — Ambulatory Visit (INDEPENDENT_AMBULATORY_CARE_PROVIDER_SITE_OTHER): Payer: Medicare Other | Admitting: Rehabilitative and Restorative Service Providers"

## 2018-05-14 ENCOUNTER — Encounter: Payer: Self-pay | Admitting: Rehabilitative and Restorative Service Providers"

## 2018-05-14 DIAGNOSIS — G8929 Other chronic pain: Secondary | ICD-10-CM

## 2018-05-14 DIAGNOSIS — R29898 Other symptoms and signs involving the musculoskeletal system: Secondary | ICD-10-CM | POA: Diagnosis not present

## 2018-05-14 DIAGNOSIS — M25511 Pain in right shoulder: Secondary | ICD-10-CM

## 2018-05-14 DIAGNOSIS — R293 Abnormal posture: Secondary | ICD-10-CM | POA: Diagnosis not present

## 2018-05-14 NOTE — Patient Instructions (Signed)
Lat stretch supine(provided hand drawing of stretch) 45 sec x 3 reps PT assist    Step back row with red TB x 15 reps each side

## 2018-05-14 NOTE — Therapy (Signed)
[place], [place], [address] Phone: [phone]   Fax:  [phone]  Physical Therapy Treatment  Patient Details  Name: Levi Cochran MRN: [id number] Date of Birth: 1941/01/04 Referring Provider: Dr Georgina Snell    Encounter Date: 05/14/2018  PT End of Session - 05/14/18 0845    Visit Number  4    Number of Visits  12    Date for PT Re-Evaluation  06/08/18    PT Start Time  0844    PT Stop Time  0942    PT Time Calculation (min)  58 min    Activity Tolerance  Patient tolerated treatment well       Past Medical History:  Diagnosis Date  . Diabetes mellitus without complication ([place])   . Hyperlipidemia   . Hypertension     Past Surgical History:  Procedure Laterality Date  . BACK SURGERY    . HAND SURGERY    . PROSTATE SURGERY    . TONSILLECTOMY      There were no vitals filed for this visit.  Subjective Assessment - 05/14/18 0845    Subjective  Shoulder is doing good. Has a "twinge" once in a while. Can feel it when he pushes or pulls with the Rt arm. Patient reports that he felt tired the day he had the ionto patch on. Felt less tired after he took the patch off.     Currently in Pain?  Yes    Pain Score  1     Pain Location  Shoulder    Pain Orientation  Right    Pain Descriptors / Indicators  Aching    Pain Onset  More than a month ago    Pain Frequency  Intermittent    Aggravating Factors   reaching up high; lifting something heavy; pushing; pulling                        [facility] Adult PT Treatment/Exercise - 05/14/18 0001      Shoulder Exercises: Standing   Theraband Level (Shoulder Extension)  Level 3 (Green)    Row  Strengthening;Right;Left;10 reps;Theraband 2-3 sec pause in retraction    Theraband Level (Shoulder Row)  Level 3 (Green)    Row Limitations  step back row - bow and arrow - red TB x 10 reps     Retraction  Strengthening back against pool noodle.     Theraband Level (Shoulder Retraction)  Level 2 (Red)    Other Standing Exercises  scap squeeze 10 sec x 10; L's x 10' W's x 10 with swim noodle       Shoulder Exercises: Stretch   Other Shoulder Stretches  3 position doorway stretch 30 sec x 2 reps each position     Other Shoulder Stretches  lat stretch supine 30-45 sec x 3 PT assist       Moist Heat Therapy   Number Minutes Moist Heat  15 Minutes    Moist Heat Location  Shoulder Rt and LB      Electrical Stimulation   Electrical Stimulation Location  Rt shoulder girdle    Electrical Stimulation Action  TENS    Electrical Stimulation Parameters  to tolerance    Electrical Stimulation Goals  Tone;Pain      Manual Therapy   Manual therapy comments  pt supine    Joint Mobilization  GH mobs/circumduction    Soft tissue mobilization  deep tissue work  through the Rt shoudler girdle focus on pecs and teres    Scapular Mobilization  Rt distraction and inferior glide    Passive ROM  PROM Rt shoulder flexion; scaptioin; ER; IR              PT Education - 05/14/18 0933    Education Details  HEP    Person(s) Educated  Patient    Methods  Explanation;Demonstration;Tactile cues;Verbal cues;Handout;Other (comment)    Comprehension  Verbalized understanding;Returned demonstration;Verbal cues required;Tactile cues required          PT Long Term Goals - 05/14/18 0850      PT LONG TERM GOAL #1   Title  Improve posture and alignment with patient to demonstrate imporved upright posture with posterior shoulder girdle engaged 06/08/18    Time  6    Period  Weeks    Status  Partially Met      PT LONG TERM GOAL #2   Title  Increase AROM Rt shoulder to =/> than AROM Lt shoulder with no pain 06/08/18    Time  6    Period  Weeks    Status  On-going      PT LONG TERM GOAL #3   Title  Patient to report pain free functional activity level 06/08/18    Time  6    Period  Weeks    Status  On-going      PT LONG TERM GOAL #4   Title   Independent in HEP 06/08/18    Time  6    Period  Weeks    Status  On-going      PT LONG TERM GOAL #5   Title  Improve FOTO to </= 33% limitation 06/08/18    Time  6    Period  Weeks    Status  On-going            Plan - 05/14/18 0850    Clinical Impression Statement  Continued progress with Rt shoulder rehab. Patient has less pain and has increased his activity level. Patient is consistent with HEP. Unsure of benefits of the ionto. Will continue with remainder of program and hold ionto. Progressing well toward stated goals of therapy.     Rehab Potential  Good    PT Frequency  2x / week    PT Duration  6 weeks    PT Treatment/Interventions  Patient/family education;ADLs/Self Care Home Management;Cryotherapy;Electrical Stimulation;Iontophoresis 64m/ml Dexamethasone;Moist Heat;Ultrasound;Dry needling;Manual techniques;Neuromuscular re-education;Therapeutic activities;Therapeutic exercise    PT Next Visit Plan  continue postural work, including thoracic ext and post shoulder girdle strengthening.     Consulted and Agree with Plan of Care  Patient       Patient will benefit from skilled therapeutic intervention in order to improve the following deficits and impairments:  Postural dysfunction, Improper body mechanics, Increased fascial restricitons, Increased muscle spasms, Hypomobility, Decreased mobility, Decreased range of motion, Decreased activity tolerance, Pain  Visit Diagnosis: Chronic right shoulder pain  Abnormal posture  Other symptoms and signs involving the musculoskeletal system     Problem List Patient Active Problem List   Diagnosis Date Noted  . Chronic right shoulder pain 11/01/2017  . Trigger finger, acquired 04/26/2017  . Brachial plexopathy 04/18/2017  . Carpal tunnel syndrome of left wrist 04/18/2017  . Ulnar neuropathy at elbow of left upper extremity 04/18/2017  . S/P shoulder replacement, left 10/11/2016  . Situational depression 09/12/2016  .  Left shoulder pain 08/23/2016  . OSA (obstructive sleep apnea)  07/14/2016  . Dysgeusia 07/13/2016  . Type 2 diabetes mellitus without complication ([place]) 82/95/6213  . Benign enlargement of prostate 10/28/2014  . Contracture of palmar fascia 03/18/2013  . Acquired cyst of kidney 01/31/2013  . Kidney stone 09/24/2012  . Benign essential hypertension 04/17/2012  . Mixed hyperlipidemia 04/17/2012  . Other specified glaucoma 04/17/2012    Velton Roselle Nilda Simmer PT, MPH  05/14/2018, 9:33 AM  [facility] [place], [place], [address] Phone: [phone]   Fax:  [phone]  Name: BODEN STUCKY MRN: [id number] Date of Birth: [birth year]

## 2018-05-22 ENCOUNTER — Ambulatory Visit (INDEPENDENT_AMBULATORY_CARE_PROVIDER_SITE_OTHER): Payer: Medicare Other | Admitting: Rehabilitative and Restorative Service Providers"

## 2018-05-22 ENCOUNTER — Encounter: Payer: Self-pay | Admitting: Rehabilitative and Restorative Service Providers"

## 2018-05-22 DIAGNOSIS — M25511 Pain in right shoulder: Secondary | ICD-10-CM | POA: Diagnosis present

## 2018-05-22 DIAGNOSIS — R29898 Other symptoms and signs involving the musculoskeletal system: Secondary | ICD-10-CM | POA: Diagnosis not present

## 2018-05-22 DIAGNOSIS — R293 Abnormal posture: Secondary | ICD-10-CM

## 2018-05-22 DIAGNOSIS — G8929 Other chronic pain: Secondary | ICD-10-CM

## 2018-05-22 NOTE — Patient Instructions (Signed)
   Reverse wall push up - standing with back to wall elbows bent at 80 degrees  Push body away from wall squeezing shoulder blades together  Hold 5 sec x 10 reps

## 2018-05-22 NOTE — Therapy (Signed)
[place], [place], [address] Phone: [phone]   Fax:  [phone]  Physical Therapy Treatment  Patient Details  Name: Levi Cochran MRN: [id number] Date of Birth: 1941/10/01 Referring Provider: Dr Georgina Snell    Encounter Date: 05/22/2018  PT End of Session - 05/22/18 0853    Visit Number  5    Number of Visits  12    Date for PT Re-Evaluation  06/08/18    PT Start Time  0849    PT Stop Time  0944    PT Time Calculation (min)  55 min    Activity Tolerance  Patient tolerated treatment well       Past Medical History:  Diagnosis Date  . Diabetes mellitus without complication ([place])   . Hyperlipidemia   . Hypertension     Past Surgical History:  Procedure Laterality Date  . BACK SURGERY    . HAND SURGERY    . PROSTATE SURGERY    . TONSILLECTOMY      There were no vitals filed for this visit.  Subjective Assessment - 05/22/18 0853    Subjective  Shoulder continues to improve gradually. Some tenderness after getting a ladder out of his shed. He is still working on his exercises.     Currently in Pain?  No/denies         [facility] PT Assessment - 05/22/18 0001      Assessment   Medical Diagnosis  Rt shoulder dysfunction    Referring Provider  Dr Georgina Snell     Onset Date/Surgical Date  10/14/17    Hand Dominance  Right    Next MD Visit  06/01/18      AROM   Right Shoulder Extension  62 Degrees    Right Shoulder Flexion  141 Degrees    Right Shoulder ABduction  136 Degrees    Right Shoulder Internal Rotation  25 Degrees    Right Shoulder External Rotation  75 Degrees    Left Shoulder Extension  64 Degrees    Left Shoulder Flexion  120 Degrees    Left Shoulder ABduction  94 Degrees    Left Shoulder Internal Rotation  35 Degrees    Left Shoulder External Rotation  88 Degrees                   [facility] Adult PT Treatment/Exercise - 05/22/18 0001      Shoulder Exercises: Standing    Other Standing Exercises  scap squeeze 10 sec x 10; L's x 10' W's x 10 with swim noodle     Other Standing Exercises  reverse wall push up 5 sec hold x 10 reps       Shoulder Exercises: ROM/Strengthening   UBE (Upper Arm Bike)  L2 x 4 min alt fwd/back      Shoulder Exercises: Stretch   Other Shoulder Stretches  3 position doorway stretch 30 sec x 2 reps each position     Other Shoulder Stretches  lat stretch supine 30-45 sec x 3 PT assist       Moist Heat Therapy   Number Minutes Moist Heat  20 Minutes    Moist Heat Location  Shoulder Rt and LB      Manual Therapy   Manual therapy comments  pt supine    Joint Mobilization  GH mobs/circumduction    Soft tissue mobilization  deep tissue work through the [employer] girdle focus on pecs and teres  Scapular Mobilization  Rt distraction and inferior glide    Passive ROM  PROM Rt shoulder flexion; scaptioin; ER; IR              PT Education - 05/22/18 0928    Education Details  HEP     Person(s) Educated  Patient    Methods  Explanation;Demonstration;Tactile cues;Verbal cues;Handout    Comprehension  Verbalized understanding;Returned demonstration;Verbal cues required;Tactile cues required          PT Long Term Goals - 05/22/18 0855      PT LONG TERM GOAL #1   Title  Improve posture and alignment with patient to demonstrate imporved upright posture with posterior shoulder girdle engaged 06/08/18    Time  6    Period  Weeks    Status  Partially Met      PT LONG TERM GOAL #2   Title  Increase AROM Rt shoulder to =/> than AROM Lt shoulder with no pain 06/08/18    Time  6    Period  Weeks    Status  Partially Met      PT LONG TERM GOAL #3   Title  Patient to report pain free functional activity level 06/08/18    Time  6    Period  Weeks    Status  Partially Met      PT LONG TERM GOAL #4   Title  Independent in HEP 06/08/18    Time  6    Period  Weeks    Status  On-going      PT LONG TERM GOAL #5   Title   Improve FOTO to </= 33% limitation 06/08/18    Time  6    Period  Weeks    Status  On-going            Plan - 05/22/18 0929    Clinical Impression Statement  Excellent progress with little pain, increased ROM and decreased palpable tightness. Progressing well toward goals of therapy.     Rehab Potential  Good    PT Frequency  2x / week    PT Duration  6 weeks    PT Treatment/Interventions  Patient/family education;ADLs/Self Care Home Management;Cryotherapy;Electrical Stimulation;Iontophoresis 6m/ml Dexamethasone;Moist Heat;Ultrasound;Dry needling;Manual techniques;Neuromuscular re-education;Therapeutic activities;Therapeutic exercise    PT Next Visit Plan  continue postural work, including thoracic ext and post shoulder girdle strengthening. MD apt 06/01/18 - note to MD at next visit.     Consulted and Agree with Plan of Care  Patient       Patient will benefit from skilled therapeutic intervention in order to improve the following deficits and impairments:  Postural dysfunction, Improper body mechanics, Increased fascial restricitons, Increased muscle spasms, Hypomobility, Decreased mobility, Decreased range of motion, Decreased activity tolerance, Pain  Visit Diagnosis: Chronic right shoulder pain  Abnormal posture  Other symptoms and signs involving the musculoskeletal system     Problem List Patient Active Problem List   Diagnosis Date Noted  . Chronic right shoulder pain 11/01/2017  . Trigger finger, acquired 04/26/2017  . Brachial plexopathy 04/18/2017  . Carpal tunnel syndrome of left wrist 04/18/2017  . Ulnar neuropathy at elbow of left upper extremity 04/18/2017  . S/P shoulder replacement, left 10/11/2016  . Situational depression 09/12/2016  . Left shoulder pain 08/23/2016  . OSA (obstructive sleep apnea) 07/14/2016  . Dysgeusia 07/13/2016  . Type 2 diabetes mellitus without complication ([place] 076/72/0947 . Benign enlargement of prostate 10/28/2014  .  Contracture of palmar  fascia 03/18/2013  . Acquired cyst of kidney 01/31/2013  . Kidney stone 09/24/2012  . Benign essential hypertension 04/17/2012  . Mixed hyperlipidemia 04/17/2012  . Other specified glaucoma 04/17/2012    Jossilyn Benda Nilda Simmer PT, MPH  05/22/2018, 9:31 AM  [facility] [place], [place], [address] Phone: [phone]   Fax:  [phone]  Name: Levi Cochran MRN: [id number] Date of Birth: 1941-02-04

## 2018-05-29 ENCOUNTER — Encounter: Payer: Self-pay | Admitting: Rehabilitative and Restorative Service Providers"

## 2018-05-29 ENCOUNTER — Ambulatory Visit (INDEPENDENT_AMBULATORY_CARE_PROVIDER_SITE_OTHER): Payer: Medicare Other | Admitting: Rehabilitative and Restorative Service Providers"

## 2018-05-29 DIAGNOSIS — R29898 Other symptoms and signs involving the musculoskeletal system: Secondary | ICD-10-CM

## 2018-05-29 DIAGNOSIS — M25511 Pain in right shoulder: Secondary | ICD-10-CM

## 2018-05-29 DIAGNOSIS — R293 Abnormal posture: Secondary | ICD-10-CM | POA: Diagnosis not present

## 2018-05-29 DIAGNOSIS — G8929 Other chronic pain: Secondary | ICD-10-CM | POA: Diagnosis not present

## 2018-05-29 NOTE — Therapy (Signed)
[place], [place], [address] Phone: [phone]   Fax:  [phone]  Physical Therapy Treatment  Patient Details  Name: Levi Cochran MRN: [id number] Date of Birth: Mar 26, 1941 Referring Provider: Dr Georgina Snell   Encounter Date: 05/29/2018  PT End of Session - 05/29/18 0840    Visit Number  6    Number of Visits  12    Date for PT Re-Evaluation  06/08/18    PT Start Time  0841    PT Stop Time  0937    PT Time Calculation (min)  56 min    Activity Tolerance  Patient tolerated treatment well       Past Medical History:  Diagnosis Date  . Diabetes mellitus without complication ([place])   . Hyperlipidemia   . Hypertension     Past Surgical History:  Procedure Laterality Date  . BACK SURGERY    . HAND SURGERY    . PROSTATE SURGERY    . TONSILLECTOMY      There were no vitals filed for this visit.  Subjective Assessment - 05/29/18 0841    Subjective  Out of town over the weekeknd and did not do his exercises - then did more exercises than usual Sunday. He was sore Sunday night and yesterday. Less soreness noted today. Sees Dr Georgina Snell Friday. Wants to wait utntil he sees MD to decide about further therapy.     Currently in Pain?  Yes    Pain Score  1          [facility] PT Assessment - 05/29/18 0001      Assessment   Medical Diagnosis  Rt shoulder dysfunction    Referring Provider  Dr Georgina Snell    Onset Date/Surgical Date  10/14/17    Hand Dominance  Right    Next MD Visit  06/01/18      Observation/Other Assessments   Focus on Therapeutic Outcomes (FOTO)   33% limitation       AROM   Right Shoulder Extension  63 Degrees    Right Shoulder Flexion  141 Degrees    Right Shoulder ABduction  140 Degrees    Right Shoulder Internal Rotation  29 Degrees    Right Shoulder External Rotation  79 Degrees    Left Shoulder Extension  64 Degrees    Left Shoulder Flexion  120 Degrees    Left Shoulder  ABduction  94 Degrees    Left Shoulder Internal Rotation  35 Degrees    Left Shoulder External Rotation  88 Degrees      Strength   Overall Strength Comments  ~ 5/5 bilat UE's - slight pain with resistive testing Rt shoulder flexion, abduction, IR      Palpation   Palpation comment  muscular tightness Rt > Lt pecs; upper trap; leveator; teres; cervical musculature                    [facility] Adult PT Treatment/Exercise - 05/29/18 0001      Shoulder Exercises: Standing   Theraband Level (Shoulder Extension)  Level 3 (Green)    Extension Weight (lbs)  kick back in bent forward posture - elbow ext 5 # x 10(start with 2#)    Row  Strengthening;Right;Left;10 reps;Theraband    Theraband Level (Shoulder Row)  Level 3 (Green)    Row Weight (lbs)  bent row with 5 # wt x 10(will start with 2# weight at home)    Row  Limitations  step back row - bow and arrow - red TB x 10 reps     Retraction  Strengthening    Theraband Level (Shoulder Retraction)  Level 2 (Red)    Other Standing Exercises  scap squeeze 10 sec x 10; L's x 10' W's x 10 with swim noodle     Other Standing Exercises  biceps curl 3# bilat x 15       Shoulder Exercises: ROM/Strengthening   UBE (Upper Arm Bike)  L2 x 4 min alt fwd/back      Shoulder Exercises: Stretch   Other Shoulder Stretches  3 position doorway stretch 30 sec x 2 reps each position                   PT Long Term Goals - 05/29/18 0841      PT LONG TERM GOAL #1   Title  Improve posture and alignment with patient to demonstrate imporved upright posture with posterior shoulder girdle engaged 06/08/18    Time  6    Period  Weeks    Status  Partially Met      PT LONG TERM GOAL #2   Title  Increase AROM Rt shoulder to =/> than AROM Lt shoulder with no pain 06/08/18    Time  6    Period  Weeks    Status  Achieved      PT LONG TERM GOAL #3   Title  Patient to report pain free functional activity level 06/08/18    Time  6    Period  Weeks     Status  Partially Met      PT LONG TERM GOAL #4   Title  Independent in HEP 06/08/18    Time  6    Period  Weeks    Status  Achieved      PT LONG TERM GOAL #5   Title  Improve FOTO to </= 33% limitation 06/08/18    Time  6    Period  Weeks    Status  Achieved            Plan - 05/29/18 0919    Clinical Impression Statement  Progressing well with shoulder rehab. Levi Cochran has decreased pain; increased ROM; improved functional activity level. He has achieved FOTO goal and part of rehab goals. He continues to have some intermittent pain in the anterior Rt shoulder and has muscular tightness through anterior chest and shoulder. He will benefit from continuing PT for a few more visits to achieve full rehab potential.      Rehab Potential  Good    PT Frequency  2x / week    PT Duration  6 weeks    PT Treatment/Interventions  Patient/family education;ADLs/Self Care Home Management;Cryotherapy;Electrical Stimulation;Iontophoresis 68m/ml Dexamethasone;Moist Heat;Ultrasound;Dry needling;Manual techniques;Neuromuscular re-education;Therapeutic activities;Therapeutic exercise    PT Next Visit Plan  continue postural work, including thoracic ext and post shoulder girdle strengthening     Consulted and Agree with Plan of Care  Patient       Patient will benefit from skilled therapeutic intervention in order to improve the following deficits and impairments:  Postural dysfunction, Improper body mechanics, Increased fascial restricitons, Increased muscle spasms, Hypomobility, Decreased mobility, Decreased range of motion, Decreased activity tolerance, Pain  Visit Diagnosis: Chronic right shoulder pain  Abnormal posture  Other symptoms and signs involving the musculoskeletal system     Problem List Patient Active Problem List   Diagnosis Date Noted  . Chronic right  shoulder pain 11/01/2017  . Trigger finger, acquired 04/26/2017  . Brachial plexopathy 04/18/2017  . Carpal tunnel syndrome  of left wrist 04/18/2017  . Ulnar neuropathy at elbow of left upper extremity 04/18/2017  . S/P shoulder replacement, left 10/11/2016  . Situational depression 09/12/2016  . Left shoulder pain 08/23/2016  . OSA (obstructive sleep apnea) 07/14/2016  . Dysgeusia 07/13/2016  . Type 2 diabetes mellitus without complication ([place]) 59/53/9672  . Benign enlargement of prostate 10/28/2014  . Contracture of palmar fascia 03/18/2013  . Acquired cyst of kidney 01/31/2013  . Kidney stone 09/24/2012  . Benign essential hypertension 04/17/2012  . Mixed hyperlipidemia 04/17/2012  . Other specified glaucoma 04/17/2012    Lun Muro Nilda Simmer PT, MPH  05/29/2018, 9:30 AM  [facility] [address] [place], [place], [address] Phone: [phone]   Fax:  [phone]  Name: Levi Cochran MRN: [id number] Date of Birth: Mar 26, 1941

## 2018-06-01 ENCOUNTER — Ambulatory Visit: Payer: Medicare Other | Admitting: Family Medicine

## 2018-06-05 ENCOUNTER — Ambulatory Visit (INDEPENDENT_AMBULATORY_CARE_PROVIDER_SITE_OTHER): Payer: Medicare Other | Admitting: Family Medicine

## 2018-06-05 ENCOUNTER — Encounter: Payer: Self-pay | Admitting: Family Medicine

## 2018-06-05 VITALS — BP 134/56 | HR 66 | Wt 185.0 lb

## 2018-06-05 DIAGNOSIS — E119 Type 2 diabetes mellitus without complications: Secondary | ICD-10-CM

## 2018-06-05 DIAGNOSIS — M25511 Pain in right shoulder: Secondary | ICD-10-CM | POA: Diagnosis not present

## 2018-06-05 NOTE — Patient Instructions (Addendum)
Thank you for coming in today. Attend last PT appointmet and focus on conitnued home exercises.  Recheck with me as needed.

## 2018-06-05 NOTE — Progress Notes (Signed)
Levi Cochran is a 77 y.o. male who presents to [facility] today for shoulder pain.   Levi Cochran had a shoulder injection due to persistent shoulder pain on 6/7. He says the injection worked very well and has significantly decreased his pain.  He says his pain is now a 2/10 at its worst and was 10/10 before the injection. He says he has no pain at night and does not take any additional medication for the pain. He says he has increased pain if he strains himself too much but he then will dial it back.  He has also been going to PT which he says has been helping.  Over all he is very pleased with how his shoulder is feeling.   He is due for a diabetic foot exam today.  He notes blood sugar has been typically pretty well controlled.  ROS:  As above  Exam:  BP (!) 134/56   Pulse 66   Wt 185 lb (83.9 kg)   BMI 26.54 kg/m  General: Well Developed, well nourished, and in no acute distress.  Neuro/Psych: Alert and oriented x3, extra-ocular muscles intact, able to move all 4 extremities, sensation grossly intact. Skin: Warm and dry, no rashes noted.  Respiratory: Not using accessory muscles, speaking in full sentences, trachea midline.  Cardiovascular: Pulses palpable, no extremity edema. Abdomen: Does not appear distended. Right Shoulder: normal appearing, normal strength. Pain with active abduction past 100 degrees, ROM otherwise normal. Radial pulse 2+  Diabetic Foot Exam - Simple   Simple Foot Form Diabetic Foot exam was performed with the following findings:  Yes 06/05/2018 10:00 AM  Visual Inspection See comments:  Yes Sensation Testing See comments:  Yes Pulse Check Posterior Tibialis and Dorsalis pulse intact bilaterally:  Yes Comments Onochymycosis toenail BL.  Decreased sensation.  Normal lulses         Assessment and Plan: 77 y.o. male with right shoulder pain. He is doing extremely well following his injection. His pain is  significantly reduced and he is doing well in physical therapy. He should do one more formal PT session and then focus on home exercises and take ibuprofen as needed for increased pain.  Diabetes: His diabetic foot exam showed moderately decreased sensation bilaterally.  The plan will be to continue his current regimen and focus on exercise and a healthy diet. Of note, his blood glucose dropped a slightly following his injection but has since returned to baseline.     Historical information moved to improve visibility of documentation.  Past Medical History:  Diagnosis Date  . Diabetes mellitus without complication ([place])   . Hyperlipidemia   . Hypertension    Past Surgical History:  Procedure Laterality Date  . BACK SURGERY    . HAND SURGERY    . PROSTATE SURGERY    . TONSILLECTOMY     Social History   Tobacco Use  . Smoking status: Former Smoker    Last attempt to quit: 11/14/1968    Years since quitting: 49.5  . Smokeless tobacco: Never Used  Substance Use Topics  . Alcohol use: Never    Frequency: Never   family history includes Cancer in his mother; Diabetes in his mother; Heart attack in his sister.  Medications: Current Outpatient Medications  Medication Sig Dispense Refill  . amLODipine (NORVASC) 5 MG tablet Take 1 tablet (5 mg total) by mouth daily. 90 tablet 3  . aspirin EC 81 MG tablet Take 81 mg  by mouth.    . DULoxetine (CYMBALTA) 20 MG capsule Take 1 capsule (20 mg total) by mouth daily. 90 capsule 3  . gabapentin (NEURONTIN) 300 MG capsule Take 1 capsule (300 mg total) by mouth at bedtime. 90 capsule 3  . latanoprost (XALATAN) 0.005 % ophthalmic solution Administer 1 drop to both eyes daily.     Marland Kitchen lisinopril-hydrochlorothiazide (PRINZIDE,ZESTORETIC) 20-12.5 MG tablet Take 2 tablets by mouth daily. 180 tablet 3  . metFORMIN (GLUCOPHAGE) 500 MG tablet Take 2 tablets (1,000 mg total) by mouth 2 (two) times daily with a meal. 360 tablet 3  . simvastatin (ZOCOR) 10 MG  tablet Take 1 tablet (10 mg total) by mouth daily. 90 tablet 3  . timolol (TIMOPTIC) 0.5 % ophthalmic solution Administer 1 drop to both eyes Two (2) times a day.      No current facility-administered medications for this visit.    No Known Allergies    Discussed warning signs or symptoms. Please see discharge instructions. Patient expresses understanding.  I personally was present and performed or re-performed the history, physical exam and medical decision-making activities of this service and have verified that the service and findings are accurately documented in the student's note. ___________________________________________ Lynne Leader M.D., ABFM., CAQSM. Primary Care and Sports Medicine Adjunct Instructor of [employer]

## 2018-06-08 ENCOUNTER — Encounter: Payer: Self-pay | Admitting: Rehabilitative and Restorative Service Providers"

## 2018-06-08 ENCOUNTER — Ambulatory Visit (INDEPENDENT_AMBULATORY_CARE_PROVIDER_SITE_OTHER): Payer: Medicare Other | Admitting: Rehabilitative and Restorative Service Providers"

## 2018-06-08 DIAGNOSIS — R29898 Other symptoms and signs involving the musculoskeletal system: Secondary | ICD-10-CM | POA: Diagnosis not present

## 2018-06-08 DIAGNOSIS — R293 Abnormal posture: Secondary | ICD-10-CM

## 2018-06-08 DIAGNOSIS — M25511 Pain in right shoulder: Secondary | ICD-10-CM | POA: Diagnosis not present

## 2018-06-08 DIAGNOSIS — G8929 Other chronic pain: Secondary | ICD-10-CM

## 2018-06-08 NOTE — Patient Instructions (Signed)
Scapula Adduction With Pectoralis Stretch: Low - Standing   Shoulders at 45 hands even with shoulders, keeping weight through legs, shift weight forward until you feel pull or stretch through the front of your chest. Hold _30__ seconds. Do _3__ times, _2-4__ times per day.   Scapula Adduction With Pectoralis Stretch: Mid-Range - Standing   Shoulders at 90 elbows even with shoulders, keeping weight through legs, shift weight forward until you feel pull or strength through the front of your chest. Hold __30_ seconds. Do _3__ times, __2-4_ times per day.   Scapula Adduction With Pectoralis Stretch: High - Standing   Shoulders at 120 hands up high on the doorway, keeping weight on feet, shift weight forward until you feel pull or stretch through the front of your chest. Hold _30__ seconds. Do _3__ times, _2-3__ times per day.   Resisted External Rotation: in Neutral - Bilateral squeeze around the noodle    PALMS UP Sit or stand, tubing in both hands, elbows at sides, bent to 90, forearms forward. Pinch shoulder blades together and rotate forearms out. Keep elbows at sides. Repeat __10__ times per set. Do _2-3___ sets per session. Do _1__ sessions per day.   Low Row: Standing   Face anchor, feet shoulder width apart. Palms up, pull arms back, squeezing shoulder blades together. Repeat 10__ times per set. Do 2-3__ sets per session. Do 1_ sessions per week. Anchor Height: Waist  Step back like a bow and arrow 10 reps for 2-3 sets 1 time/day

## 2018-06-08 NOTE — Therapy (Signed)
[facility] [place], [place], [address] Phone: [phone]   Fax:  [phone]  Physical Therapy Treatment  Patient Details  Name: Levi Cochran MRN: [id number] Date of Birth: 01/28/1941 Referring Provider: Dr Georgina Snell    Encounter Date: 06/08/2018  PT End of Session - 06/08/18 1018    Visit Number  7    Number of Visits  12    Date for PT Re-Evaluation  06/08/18    PT Start Time  9201    PT Stop Time  1110    PT Time Calculation (min)  55 min    Activity Tolerance  Patient tolerated treatment well       Past Medical History:  Diagnosis Date  . Diabetes mellitus without complication ([place])   . Hyperlipidemia   . Hypertension     Past Surgical History:  Procedure Laterality Date  . BACK SURGERY    . HAND SURGERY    . PROSTATE SURGERY    . TONSILLECTOMY      There were no vitals filed for this visit.  Subjective Assessment - 06/08/18 1018    Subjective  Doing well with "a little bit of pain once in a while". Confident in continuing with independent HEP. Wants a condensed version of his exercises for home.     Currently in Pain?  No/denies         [facility] PT Assessment - 06/08/18 0001      Assessment   Medical Diagnosis  Rt shoulder dysfunction    Referring Provider  Dr Georgina Snell     Onset Date/Surgical Date  10/14/17    Hand Dominance  Right    Next MD Visit  06/01/18      Observation/Other Assessments   Focus on Therapeutic Outcomes (FOTO)   33% limitation       AROM   Right Shoulder Extension  70 Degrees    Right Shoulder Flexion  142 Degrees    Right Shoulder ABduction  145 Degrees    Right Shoulder Internal Rotation  30 Degrees    Right Shoulder External Rotation  90 Degrees    Left Shoulder Extension  64 Degrees    Left Shoulder Flexion  120 Degrees    Left Shoulder ABduction  94 Degrees    Left Shoulder Internal Rotation  35 Degrees    Left Shoulder External Rotation  88 Degrees      Strength   Overall Strength Comments  ~ 5/5 bilat UE's - slight pain with resistive testing Rt shoulder flexion, abduction, IR      Palpation   Palpation comment  muscular tightness Rt > Lt pecs; upper trap; leveator; teres; cervical musculature                    [facility] Adult PT Treatment/Exercise - 06/08/18 0001      Shoulder Exercises: Standing   Row  Strengthening;Right;Left;10 reps;Theraband    Theraband Level (Shoulder Row)  Level 3 (Green)    Row Limitations  step back row - bow and arrow - red TB x 10 reps     Retraction  Strengthening    Theraband Level (Shoulder Retraction)  Level 2 (Red)    Other Standing Exercises  scap squeeze 10 sec x 10; L's x 10' W's x 10 with swim noodle       Shoulder Exercises: ROM/Strengthening   UBE (Upper Arm Bike)  L3 x 4 min alt fwd/back  Shoulder Exercises: Stretch   Other Shoulder Stretches  3 position doorway stretch 30 sec x 2 reps each position       Moist Heat Therapy   Number Minutes Moist Heat  20 Minutes    Moist Heat Location  Shoulder Rt      Electrical Stimulation   Electrical Stimulation Location  Rt shoulder girdle    Electrical Stimulation Action  IFC    Electrical Stimulation Parameters  to tolerance    Electrical Stimulation Goals  Tone      Manual Therapy   Manual therapy comments  pt supine    Joint Mobilization  GH mobs/circumduction    Soft tissue mobilization  deep tissue work through the [employer] girdle focus on pecs and teres    Scapular Mobilization  Rt distraction and inferior glide    Passive ROM  PROM Rt shoulder flexion; scaptioin; ER; IR              PT Education - 06/08/18 1035    Education Details  HEP     Person(s) Educated  Patient    Methods  Explanation;Demonstration;Tactile cues;Handout;Verbal cues    Comprehension  Verbalized understanding;Returned demonstration;Verbal cues required;Tactile cues required          PT Long Term Goals - 06/08/18 1040      PT  LONG TERM GOAL #1   Title  Improve posture and alignment with patient to demonstrate imporved upright posture with posterior shoulder girdle engaged 06/08/18    Time  6    Period  Weeks    Status  Achieved      PT LONG TERM GOAL #2   Title  Increase AROM Rt shoulder to =/> than AROM Lt shoulder with no pain 06/08/18    Time  6    Period  Weeks    Status  Achieved      PT LONG TERM GOAL #3   Title  Patient to report pain free functional activity level 06/08/18    Time  6    Period  Weeks    Status  Achieved      PT LONG TERM GOAL #4   Title  Independent in HEP 06/08/18    Time  6    Period  Weeks    Status  Achieved      PT LONG TERM GOAL #5   Title  Improve FOTO to </= 33% limitation 06/08/18    Time  6    Period  Weeks    Status  Achieved            Plan - 06/08/18 1037    Clinical Impression Statement  Excellent progress with good resolution of pain in Rt shoulder. Patient has some intermittent pain with functional activities that are more strenuous. He is independent in HEP. Goals of therapy have been accomplished. Patient will be discharged to independent HEP.     Rehab Potential  Good    PT Frequency  2x / week    PT Treatment/Interventions  Patient/family education;ADLs/Self Care Home Management;Cryotherapy;Electrical Stimulation;Iontophoresis 61m/ml Dexamethasone;Moist Heat;Ultrasound;Dry needling;Manual techniques;Neuromuscular re-education;Therapeutic activities;Therapeutic exercise    PT Next Visit Plan  d/c to independent HEP patient will call with questions     Consulted and Agree with Plan of Care  Patient       Patient will benefit from skilled therapeutic intervention in order to improve the following deficits and impairments:  Postural dysfunction, Improper body mechanics, Increased fascial restricitons, Increased muscle spasms, Hypomobility,  Decreased mobility, Decreased range of motion, Decreased activity tolerance, Pain  Visit Diagnosis: Chronic right  shoulder pain  Abnormal posture  Other symptoms and signs involving the musculoskeletal system     Problem List Patient Active Problem List   Diagnosis Date Noted  . Chronic right shoulder pain 11/01/2017  . Trigger finger, acquired 04/26/2017  . Brachial plexopathy 04/18/2017  . Carpal tunnel syndrome of left wrist 04/18/2017  . Ulnar neuropathy at elbow of left upper extremity 04/18/2017  . S/P shoulder replacement, left 10/11/2016  . Situational depression 09/12/2016  . Left shoulder pain 08/23/2016  . OSA (obstructive sleep apnea) 07/14/2016  . Dysgeusia 07/13/2016  . Type 2 diabetes mellitus without complication ([place]) 17/61/6073  . Benign enlargement of prostate 10/28/2014  . Contracture of palmar fascia 03/18/2013  . Acquired cyst of kidney 01/31/2013  . Kidney stone 09/24/2012  . Benign essential hypertension 04/17/2012  . Mixed hyperlipidemia 04/17/2012  . Other specified glaucoma 04/17/2012    Celyn Nilda Simmer PT, MPH  06/08/2018, 10:54 AM  [facility] [address] [place], [place], [address] Phone: [phone]   Fax:  [phone]  Name: Levi Cochran MRN: [id number] Date of Birth: 1941-01-12  PHYSICAL THERAPY DISCHARGE SUMMARY  Visits from Start of Care: 7  Current functional level related to goals / functional outcomes: See progress note for discharge status    Remaining deficits: Some forward posture and tightness anterior shoulder - need to continue with HEP    Education / Equipment: HEP  Plan: Patient agrees to discharge.  Patient goals were met. Patient is being discharged due to meeting the stated rehab goals.  ?????    Celyn P. Helene Kelp PT, MPH 06/08/18 10:55 AM

## 2018-08-02 ENCOUNTER — Ambulatory Visit: Payer: Medicare Other | Admitting: Osteopathic Medicine

## 2018-08-02 DIAGNOSIS — G4733 Obstructive sleep apnea (adult) (pediatric): Secondary | ICD-10-CM | POA: Diagnosis not present

## 2018-08-02 DIAGNOSIS — E782 Mixed hyperlipidemia: Secondary | ICD-10-CM | POA: Diagnosis not present

## 2018-08-02 DIAGNOSIS — G5602 Carpal tunnel syndrome, left upper limb: Secondary | ICD-10-CM | POA: Diagnosis not present

## 2018-08-02 DIAGNOSIS — R251 Tremor, unspecified: Secondary | ICD-10-CM | POA: Diagnosis not present

## 2018-08-02 DIAGNOSIS — H906 Mixed conductive and sensorineural hearing loss, bilateral: Secondary | ICD-10-CM | POA: Diagnosis not present

## 2018-08-02 DIAGNOSIS — I10 Essential (primary) hypertension: Secondary | ICD-10-CM | POA: Diagnosis not present

## 2018-08-02 DIAGNOSIS — Z9989 Dependence on other enabling machines and devices: Secondary | ICD-10-CM | POA: Diagnosis not present

## 2018-08-02 DIAGNOSIS — E119 Type 2 diabetes mellitus without complications: Secondary | ICD-10-CM | POA: Diagnosis not present

## 2018-08-02 DIAGNOSIS — H4010X4 Unspecified open-angle glaucoma, indeterminate stage: Secondary | ICD-10-CM | POA: Diagnosis not present

## 2018-08-06 ENCOUNTER — Other Ambulatory Visit: Payer: Self-pay | Admitting: Osteopathic Medicine

## 2018-08-10 DIAGNOSIS — Z23 Encounter for immunization: Secondary | ICD-10-CM | POA: Diagnosis not present

## 2018-09-03 ENCOUNTER — Other Ambulatory Visit: Payer: Self-pay | Admitting: Osteopathic Medicine

## 2018-10-02 DIAGNOSIS — H401122 Primary open-angle glaucoma, left eye, moderate stage: Secondary | ICD-10-CM | POA: Diagnosis not present

## 2018-10-02 DIAGNOSIS — H401111 Primary open-angle glaucoma, right eye, mild stage: Secondary | ICD-10-CM | POA: Diagnosis not present

## 2018-11-02 DIAGNOSIS — Z9989 Dependence on other enabling machines and devices: Secondary | ICD-10-CM | POA: Diagnosis not present

## 2018-11-02 DIAGNOSIS — I10 Essential (primary) hypertension: Secondary | ICD-10-CM | POA: Diagnosis not present

## 2018-11-02 DIAGNOSIS — G4733 Obstructive sleep apnea (adult) (pediatric): Secondary | ICD-10-CM | POA: Diagnosis not present

## 2018-11-02 DIAGNOSIS — E119 Type 2 diabetes mellitus without complications: Secondary | ICD-10-CM | POA: Diagnosis not present

## 2018-11-02 DIAGNOSIS — E782 Mixed hyperlipidemia: Secondary | ICD-10-CM | POA: Diagnosis not present

## 2018-11-02 DIAGNOSIS — D649 Anemia, unspecified: Secondary | ICD-10-CM | POA: Diagnosis not present

## 2018-11-30 ENCOUNTER — Other Ambulatory Visit: Payer: Self-pay | Admitting: Osteopathic Medicine

## 2018-11-30 NOTE — Telephone Encounter (Signed)
Requested medication (s) are due for refill today: yes  Requested medication (s) are on the active medication list: no  Last refill:  09/04/18  Future visit scheduled: no  Notes to clinic: appointment needed    Requested Prescriptions  Pending Prescriptions Disp Refills   lisinopril-hydrochlorothiazide (PRINZIDE,ZESTORETIC) 20-12.5 MG tablet [Pharmacy Med Name: Lisinopril-hydroCHLOROthiazide 20-12.5 MG Oral Tablet] 180 tablet 0    Sig: TAKE 2 TABLETS BY MOUTH ONCE DAILY     Cardiovascular:  ACEI + Diuretic Combos Failed - 11/30/2018  9:28 AM      Failed - Na in normal range and within 180 days    Sodium  Date Value Ref Range Status  02/05/2018 138 135 - 146 mmol/L Final         Failed - K in normal range and within 180 days    Potassium  Date Value Ref Range Status  02/05/2018 4.7 3.5 - 5.3 mmol/L Final         Failed - Cr in normal range and within 180 days    Creat  Date Value Ref Range Status  02/05/2018 1.30 (H) 0.70 - 1.18 mg/dL Final    Comment:    For patients >33 years of age, the reference limit for Creatinine is approximately 13% higher for people identified as African-American. .          Failed - Ca in normal range and within 180 days    Calcium  Date Value Ref Range Status  02/05/2018 9.4 8.6 - 10.3 mg/dL Final         Failed - Valid encounter within last 6 months    Recent Outpatient Visits          5 months ago Acute pain of right shoulder   [facility], Rebekah Chesterfield, MD   7 months ago Type 2 diabetes mellitus without complication, without long-term current use of insulin [facility])   [facility], Lanelle Bal, DO   7 months ago Acute pain of right shoulder   [facility], Rebekah Chester[place], MD   9 months ago Complicated UTI (urinary tract infection)    [facility], Lanelle Bal, DO   10 months ago  Medicare annual wellness visit, subsequent   [place], Lanelle Bal, DO      Future Appointments            In 3 months Chesley Mires, MD [place] - Patient is not pregnant      Passed - Last BP in normal range    BP Readings from Last 1 Encounters:  06/05/18 (!) 134/56

## 2018-12-25 DIAGNOSIS — E538 Deficiency of other specified B group vitamins: Secondary | ICD-10-CM | POA: Diagnosis not present

## 2019-01-22 ENCOUNTER — Other Ambulatory Visit: Payer: Self-pay | Admitting: Osteopathic Medicine

## 2019-01-22 NOTE — Telephone Encounter (Signed)
Rx refill   [facility] not fill for this provider.

## 2019-02-18 ENCOUNTER — Other Ambulatory Visit: Payer: Self-pay | Admitting: Osteopathic Medicine

## 2019-03-03 ENCOUNTER — Other Ambulatory Visit: Payer: Self-pay | Admitting: Osteopathic Medicine

## 2019-03-06 ENCOUNTER — Ambulatory Visit: Payer: Medicare Other | Admitting: Pulmonary Disease

## 2019-04-04 DIAGNOSIS — H527 Unspecified disorder of refraction: Secondary | ICD-10-CM | POA: Diagnosis not present

## 2019-04-04 DIAGNOSIS — Z961 Presence of intraocular lens: Secondary | ICD-10-CM | POA: Diagnosis not present

## 2019-04-04 DIAGNOSIS — H02834 Dermatochalasis of left upper eyelid: Secondary | ICD-10-CM | POA: Diagnosis not present

## 2019-04-04 DIAGNOSIS — E119 Type 2 diabetes mellitus without complications: Secondary | ICD-10-CM | POA: Diagnosis not present

## 2019-04-04 DIAGNOSIS — H26492 Other secondary cataract, left eye: Secondary | ICD-10-CM | POA: Diagnosis not present

## 2019-04-04 DIAGNOSIS — H401122 Primary open-angle glaucoma, left eye, moderate stage: Secondary | ICD-10-CM | POA: Diagnosis not present

## 2019-04-04 DIAGNOSIS — H25811 Combined forms of age-related cataract, right eye: Secondary | ICD-10-CM | POA: Diagnosis not present

## 2019-04-04 DIAGNOSIS — H43813 Vitreous degeneration, bilateral: Secondary | ICD-10-CM | POA: Diagnosis not present

## 2019-04-04 DIAGNOSIS — H02831 Dermatochalasis of right upper eyelid: Secondary | ICD-10-CM | POA: Diagnosis not present

## 2019-04-04 DIAGNOSIS — H401111 Primary open-angle glaucoma, right eye, mild stage: Secondary | ICD-10-CM | POA: Diagnosis not present

## 2019-04-04 LAB — HM DIABETES EYE EXAM

## 2019-04-08 ENCOUNTER — Other Ambulatory Visit: Payer: Self-pay | Admitting: Osteopathic Medicine

## 2019-05-03 ENCOUNTER — Encounter: Payer: Self-pay | Admitting: Osteopathic Medicine

## 2019-06-13 DIAGNOSIS — H402234 Chronic angle-closure glaucoma, bilateral, indeterminate stage: Secondary | ICD-10-CM | POA: Diagnosis not present

## 2019-06-13 DIAGNOSIS — M25512 Pain in left shoulder: Secondary | ICD-10-CM | POA: Diagnosis not present

## 2019-06-13 DIAGNOSIS — E119 Type 2 diabetes mellitus without complications: Secondary | ICD-10-CM | POA: Diagnosis not present

## 2019-06-13 DIAGNOSIS — E1169 Type 2 diabetes mellitus with other specified complication: Secondary | ICD-10-CM | POA: Diagnosis not present

## 2019-06-13 DIAGNOSIS — E785 Hyperlipidemia, unspecified: Secondary | ICD-10-CM | POA: Diagnosis not present

## 2019-06-13 DIAGNOSIS — I10 Essential (primary) hypertension: Secondary | ICD-10-CM | POA: Diagnosis not present

## 2019-06-13 DIAGNOSIS — G5603 Carpal tunnel syndrome, bilateral upper limbs: Secondary | ICD-10-CM | POA: Diagnosis not present

## 2019-06-13 DIAGNOSIS — G8929 Other chronic pain: Secondary | ICD-10-CM | POA: Diagnosis not present

## 2019-06-13 DIAGNOSIS — Z974 Presence of external hearing-aid: Secondary | ICD-10-CM | POA: Diagnosis not present

## 2019-06-14 ENCOUNTER — Ambulatory Visit: Payer: Medicare Other | Admitting: Pulmonary Disease

## 2019-06-20 DIAGNOSIS — E119 Type 2 diabetes mellitus without complications: Secondary | ICD-10-CM | POA: Diagnosis not present

## 2019-06-20 DIAGNOSIS — H401111 Primary open-angle glaucoma, right eye, mild stage: Secondary | ICD-10-CM | POA: Diagnosis not present

## 2019-06-20 DIAGNOSIS — H401123 Primary open-angle glaucoma, left eye, severe stage: Secondary | ICD-10-CM | POA: Diagnosis not present

## 2019-06-20 DIAGNOSIS — Z961 Presence of intraocular lens: Secondary | ICD-10-CM | POA: Diagnosis not present

## 2019-06-20 DIAGNOSIS — H2511 Age-related nuclear cataract, right eye: Secondary | ICD-10-CM | POA: Diagnosis not present

## 2019-07-18 DIAGNOSIS — H401123 Primary open-angle glaucoma, left eye, severe stage: Secondary | ICD-10-CM | POA: Diagnosis not present

## 2019-07-18 DIAGNOSIS — H401111 Primary open-angle glaucoma, right eye, mild stage: Secondary | ICD-10-CM | POA: Diagnosis not present

## 2019-09-17 DIAGNOSIS — R600 Localized edema: Secondary | ICD-10-CM | POA: Diagnosis not present

## 2019-09-17 DIAGNOSIS — E785 Hyperlipidemia, unspecified: Secondary | ICD-10-CM | POA: Diagnosis not present

## 2019-09-17 DIAGNOSIS — H4010X0 Unspecified open-angle glaucoma, stage unspecified: Secondary | ICD-10-CM | POA: Diagnosis not present

## 2019-09-17 DIAGNOSIS — Z23 Encounter for immunization: Secondary | ICD-10-CM | POA: Diagnosis not present

## 2019-09-17 DIAGNOSIS — I10 Essential (primary) hypertension: Secondary | ICD-10-CM | POA: Diagnosis not present

## 2019-09-17 DIAGNOSIS — E1169 Type 2 diabetes mellitus with other specified complication: Secondary | ICD-10-CM | POA: Diagnosis not present

## 2019-09-17 DIAGNOSIS — E119 Type 2 diabetes mellitus without complications: Secondary | ICD-10-CM | POA: Diagnosis not present

## 2019-11-20 DIAGNOSIS — H401123 Primary open-angle glaucoma, left eye, severe stage: Secondary | ICD-10-CM | POA: Diagnosis not present

## 2019-11-20 DIAGNOSIS — H401111 Primary open-angle glaucoma, right eye, mild stage: Secondary | ICD-10-CM | POA: Diagnosis not present

## 2019-12-24 DIAGNOSIS — E119 Type 2 diabetes mellitus without complications: Secondary | ICD-10-CM | POA: Diagnosis not present

## 2019-12-24 DIAGNOSIS — E1169 Type 2 diabetes mellitus with other specified complication: Secondary | ICD-10-CM | POA: Diagnosis not present

## 2019-12-25 DIAGNOSIS — E1169 Type 2 diabetes mellitus with other specified complication: Secondary | ICD-10-CM | POA: Diagnosis not present

## 2019-12-25 DIAGNOSIS — M25512 Pain in left shoulder: Secondary | ICD-10-CM | POA: Diagnosis not present

## 2019-12-25 DIAGNOSIS — N289 Disorder of kidney and ureter, unspecified: Secondary | ICD-10-CM | POA: Diagnosis not present

## 2019-12-25 DIAGNOSIS — E785 Hyperlipidemia, unspecified: Secondary | ICD-10-CM | POA: Diagnosis not present

## 2019-12-25 DIAGNOSIS — D649 Anemia, unspecified: Secondary | ICD-10-CM | POA: Diagnosis not present

## 2019-12-25 DIAGNOSIS — E119 Type 2 diabetes mellitus without complications: Secondary | ICD-10-CM | POA: Diagnosis not present

## 2019-12-25 DIAGNOSIS — I10 Essential (primary) hypertension: Secondary | ICD-10-CM | POA: Diagnosis not present

## 2019-12-25 DIAGNOSIS — Z23 Encounter for immunization: Secondary | ICD-10-CM | POA: Diagnosis not present

## 2019-12-25 DIAGNOSIS — Z0000 Encounter for general adult medical examination without abnormal findings: Secondary | ICD-10-CM | POA: Diagnosis not present

## 2019-12-25 DIAGNOSIS — G8929 Other chronic pain: Secondary | ICD-10-CM | POA: Diagnosis not present

## 2019-12-26 DIAGNOSIS — N289 Disorder of kidney and ureter, unspecified: Secondary | ICD-10-CM | POA: Diagnosis not present

## 2019-12-26 DIAGNOSIS — E785 Hyperlipidemia, unspecified: Secondary | ICD-10-CM | POA: Diagnosis not present

## 2019-12-26 DIAGNOSIS — D649 Anemia, unspecified: Secondary | ICD-10-CM | POA: Diagnosis not present

## 2019-12-26 DIAGNOSIS — E119 Type 2 diabetes mellitus without complications: Secondary | ICD-10-CM | POA: Diagnosis not present

## 2019-12-26 DIAGNOSIS — S61211A Laceration without foreign body of left index finger without damage to nail, initial encounter: Secondary | ICD-10-CM | POA: Diagnosis not present

## 2019-12-26 DIAGNOSIS — Z7984 Long term (current) use of oral hypoglycemic drugs: Secondary | ICD-10-CM | POA: Diagnosis not present

## 2019-12-26 DIAGNOSIS — I10 Essential (primary) hypertension: Secondary | ICD-10-CM | POA: Diagnosis not present

## 2019-12-26 DIAGNOSIS — E7800 Pure hypercholesterolemia, unspecified: Secondary | ICD-10-CM | POA: Diagnosis not present

## 2019-12-26 DIAGNOSIS — G8929 Other chronic pain: Secondary | ICD-10-CM | POA: Diagnosis not present

## 2019-12-26 DIAGNOSIS — E1169 Type 2 diabetes mellitus with other specified complication: Secondary | ICD-10-CM | POA: Diagnosis not present

## 2019-12-26 DIAGNOSIS — Z23 Encounter for immunization: Secondary | ICD-10-CM | POA: Diagnosis not present

## 2019-12-26 DIAGNOSIS — Z79899 Other long term (current) drug therapy: Secondary | ICD-10-CM | POA: Diagnosis not present

## 2019-12-26 DIAGNOSIS — Z0000 Encounter for general adult medical examination without abnormal findings: Secondary | ICD-10-CM | POA: Diagnosis not present

## 2019-12-26 DIAGNOSIS — S61210A Laceration without foreign body of right index finger without damage to nail, initial encounter: Secondary | ICD-10-CM | POA: Diagnosis not present

## 2019-12-26 DIAGNOSIS — Z7982 Long term (current) use of aspirin: Secondary | ICD-10-CM | POA: Diagnosis not present

## 2019-12-26 DIAGNOSIS — M25512 Pain in left shoulder: Secondary | ICD-10-CM | POA: Diagnosis not present

## 2019-12-30 DIAGNOSIS — S61210A Laceration without foreign body of right index finger without damage to nail, initial encounter: Secondary | ICD-10-CM | POA: Diagnosis not present

## 2020-01-06 DIAGNOSIS — S61210A Laceration without foreign body of right index finger without damage to nail, initial encounter: Secondary | ICD-10-CM | POA: Diagnosis not present

## 2020-01-20 DIAGNOSIS — S61211D Laceration without foreign body of left index finger without damage to nail, subsequent encounter: Secondary | ICD-10-CM | POA: Diagnosis not present

## 2020-02-03 DIAGNOSIS — S61211D Laceration without foreign body of left index finger without damage to nail, subsequent encounter: Secondary | ICD-10-CM | POA: Diagnosis not present

## 2020-03-06 IMAGING — DX DG SHOULDER 2+V*R*
3 series · 3 of 3 positions shown · non-contrast
Comparison: None.

CLINICAL DATA: Right shoulder pain without known injury.

EXAM:
RIGHT SHOULDER - 2+ VIEW

[shoulder grashey]
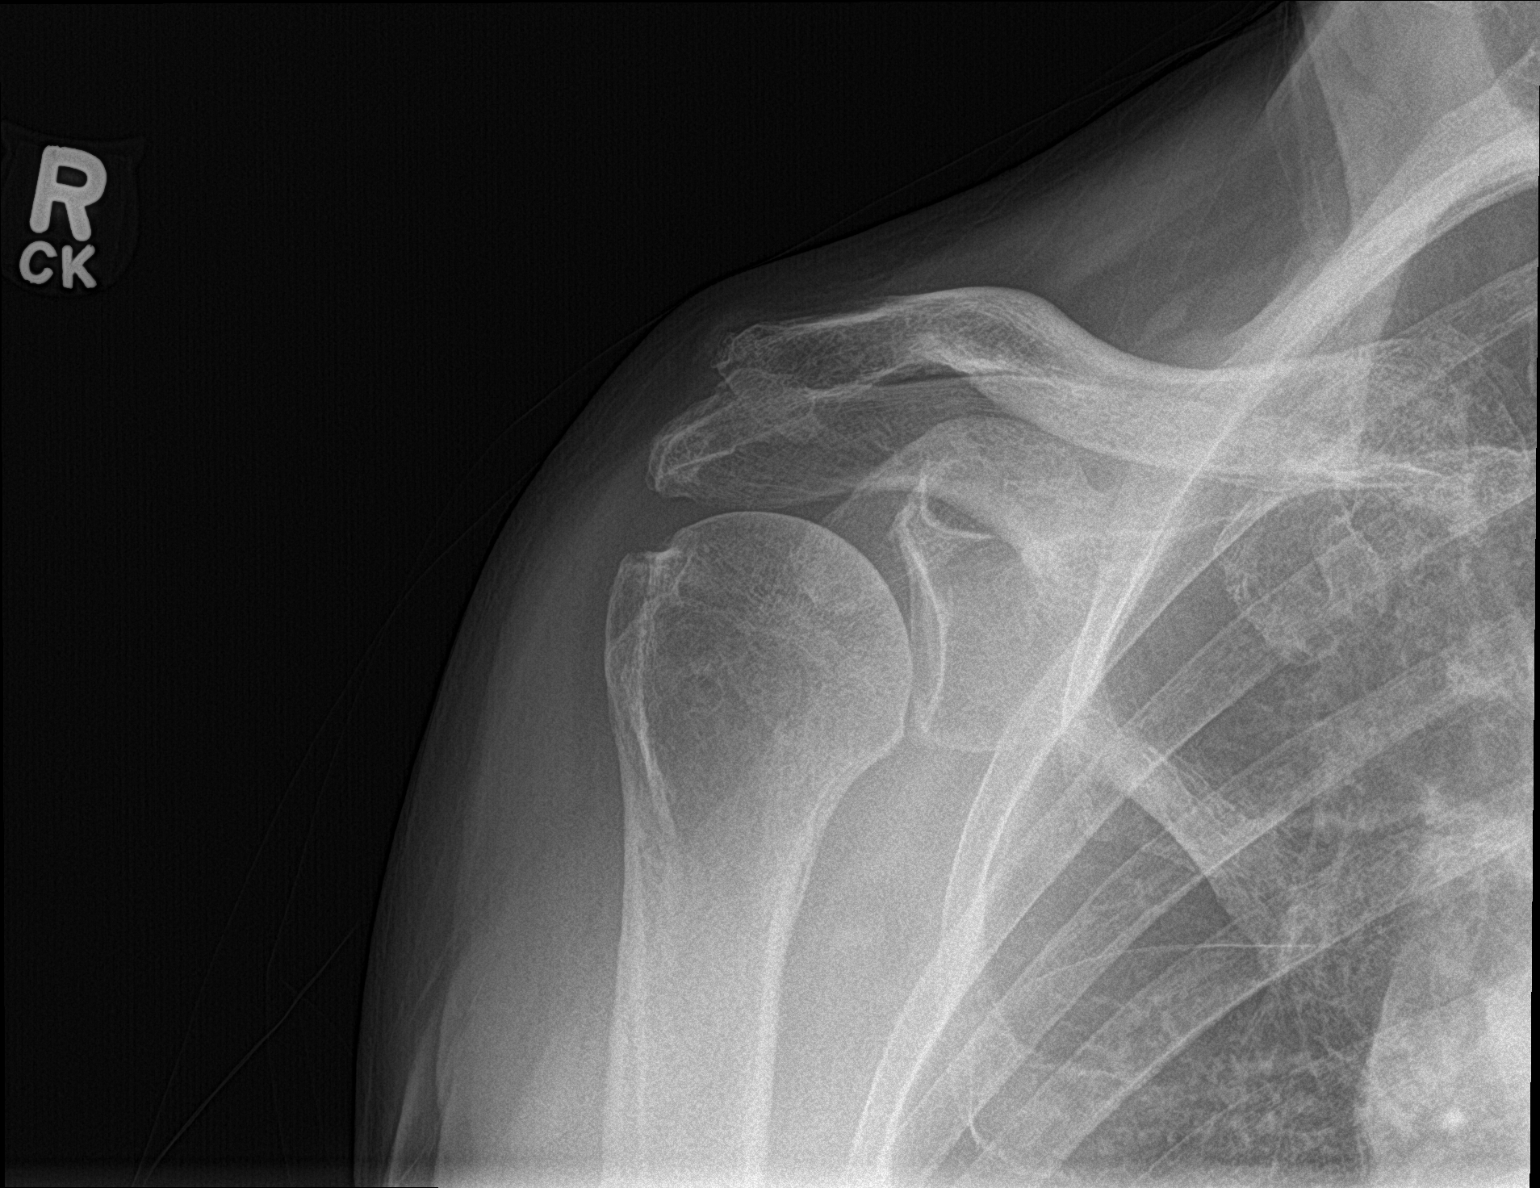

[shoulder y view]
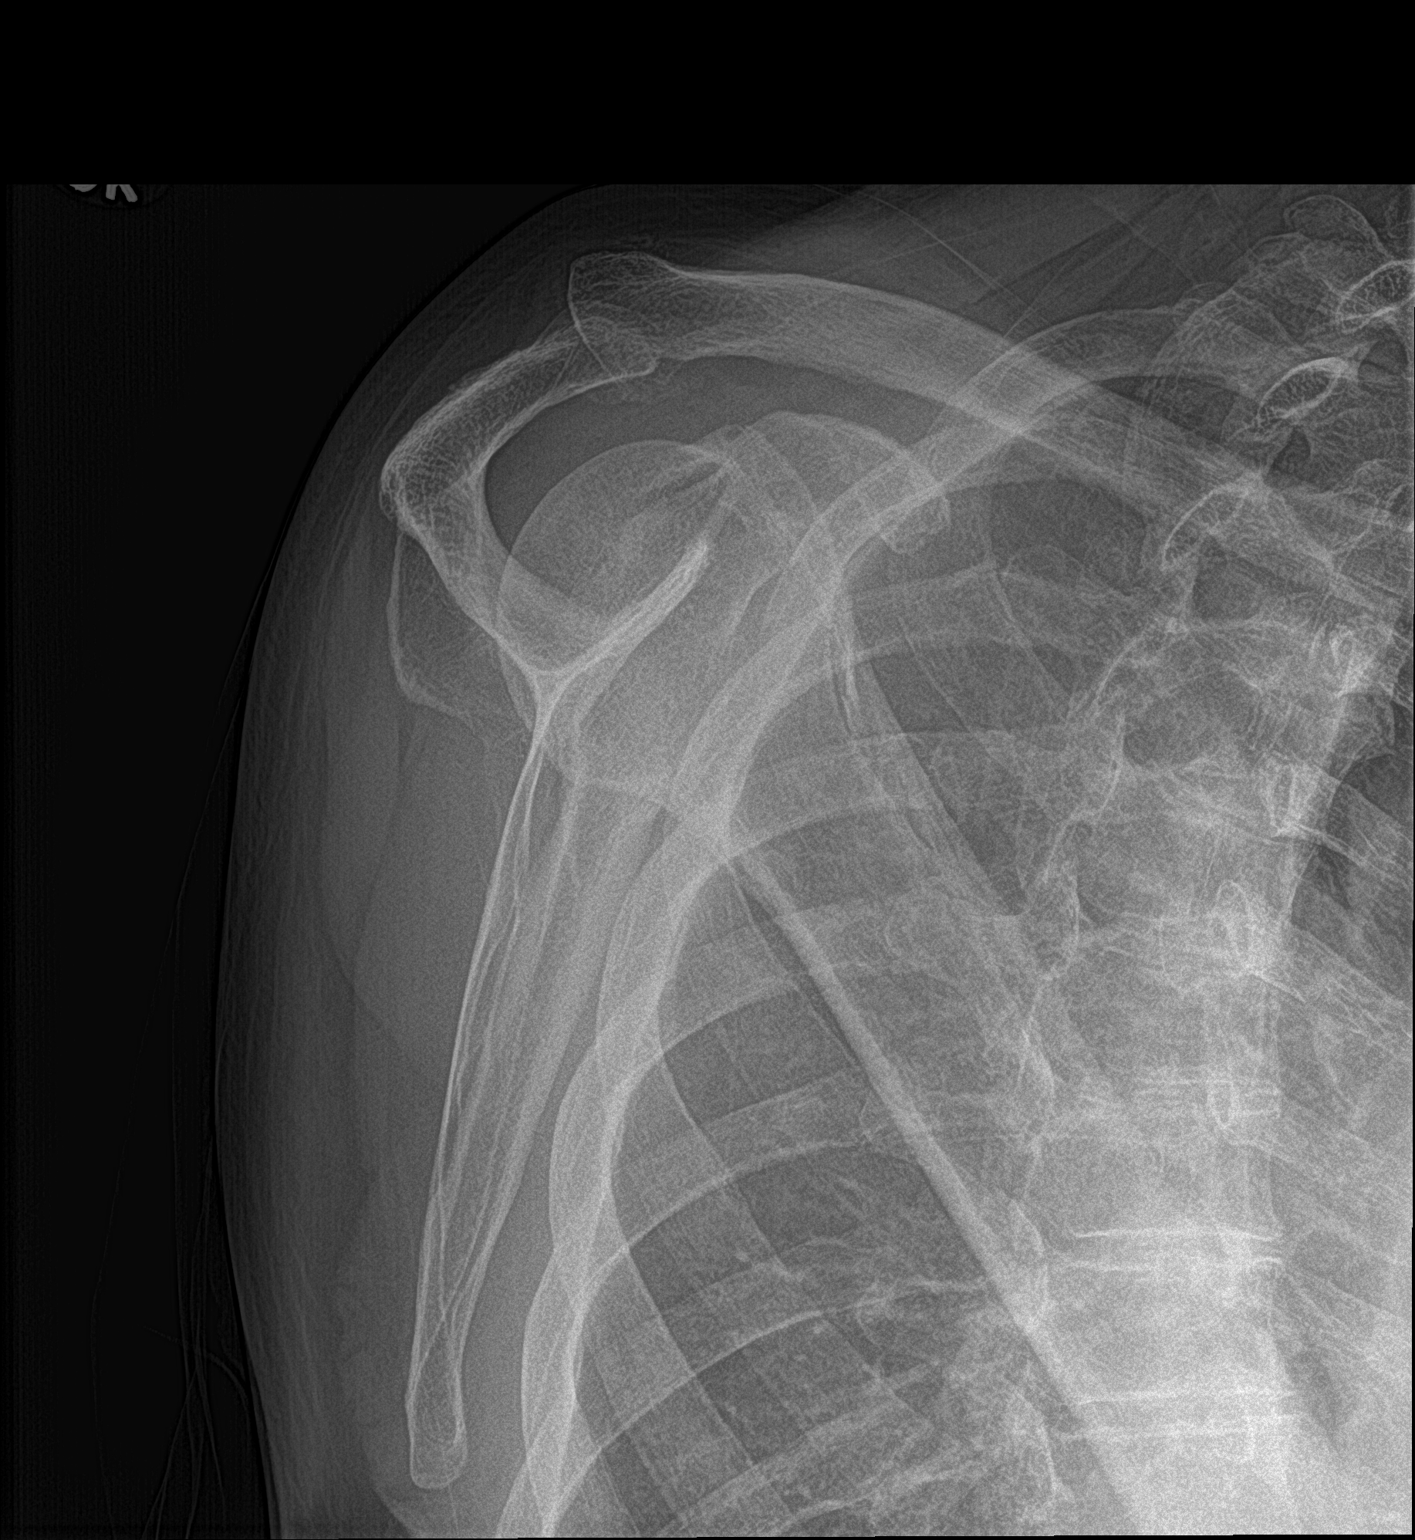

[shoulder axillary]
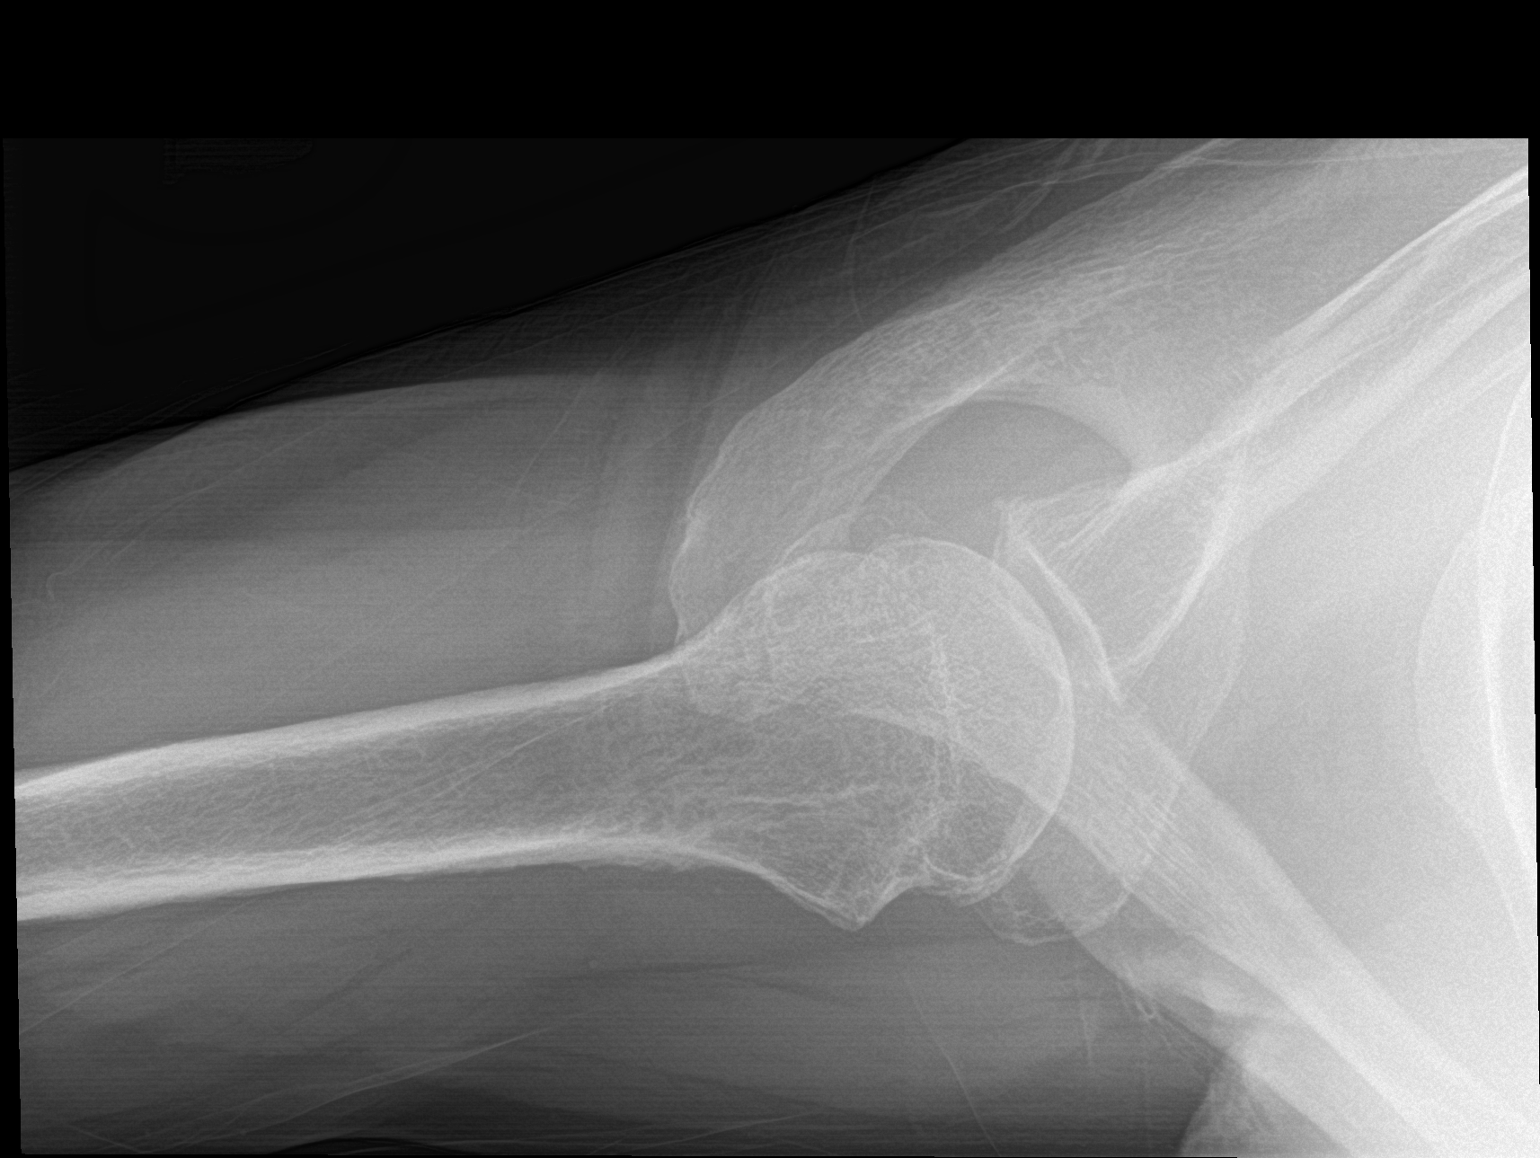

[3 of 3 positions shown; findings below may reference images not displayed]

FINDINGS: There is no evidence of fracture or dislocation. Mild degenerative
changes seen involving the acromioclavicular joint. Soft tissues are
unremarkable.
IMPRESSION: Mild degenerative joint disease of the right acromioclavicular
joint. No acute abnormality seen in the right shoulder.
